# Patient Record
Sex: Female | Born: 1975 | Race: White | Hispanic: No | Marital: Single | Smoking: Current every day smoker
Health system: Southern US, Community
[De-identification: ages and names within clinical notes are randomized; demographics above are authoritative.]

## PROBLEM LIST (undated history)

## (undated) DIAGNOSIS — K219 Gastro-esophageal reflux disease without esophagitis: Secondary | ICD-10-CM

## (undated) DIAGNOSIS — J309 Allergic rhinitis, unspecified: Secondary | ICD-10-CM

## (undated) DIAGNOSIS — Z720 Tobacco use: Secondary | ICD-10-CM

## (undated) DIAGNOSIS — F419 Anxiety disorder, unspecified: Secondary | ICD-10-CM

## (undated) DIAGNOSIS — J45909 Unspecified asthma, uncomplicated: Secondary | ICD-10-CM

## (undated) DIAGNOSIS — F329 Major depressive disorder, single episode, unspecified: Secondary | ICD-10-CM

## (undated) DIAGNOSIS — G629 Polyneuropathy, unspecified: Secondary | ICD-10-CM

## (undated) DIAGNOSIS — E119 Type 2 diabetes mellitus without complications: Secondary | ICD-10-CM

## (undated) DIAGNOSIS — F32A Depression, unspecified: Secondary | ICD-10-CM

## (undated) HISTORY — DX: Unspecified asthma, uncomplicated: J45.909

## (undated) HISTORY — DX: Allergic rhinitis, unspecified: J30.9

## (undated) HISTORY — DX: Gastro-esophageal reflux disease without esophagitis: K21.9

## (undated) HISTORY — DX: Polyneuropathy, unspecified: G62.9

## (undated) HISTORY — DX: Type 2 diabetes mellitus without complications: E11.9

## (undated) HISTORY — DX: Tobacco use: Z72.0

---
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
## 1998-02-23 ENCOUNTER — Inpatient Hospital Stay (HOSPITAL_COMMUNITY): Admission: AD | Admit: 1998-02-23 | Discharge: 1998-02-28 | Payer: Self-pay | Admitting: *Deleted

## 1998-02-24 ENCOUNTER — Encounter: Payer: Self-pay | Admitting: *Deleted

## 1998-12-26 ENCOUNTER — Inpatient Hospital Stay (HOSPITAL_COMMUNITY): Admission: RE | Admit: 1998-12-26 | Discharge: 1998-12-28 | Payer: Self-pay | Admitting: *Deleted

## 1998-12-27 ENCOUNTER — Encounter: Payer: Self-pay | Admitting: *Deleted

## 1999-10-13 ENCOUNTER — Other Ambulatory Visit: Admission: RE | Admit: 1999-10-13 | Discharge: 1999-10-13 | Payer: Self-pay | Admitting: Obstetrics and Gynecology

## 1999-10-13 ENCOUNTER — Encounter (INDEPENDENT_AMBULATORY_CARE_PROVIDER_SITE_OTHER): Payer: Self-pay

## 1999-10-14 ENCOUNTER — Inpatient Hospital Stay (HOSPITAL_COMMUNITY): Admission: EM | Admit: 1999-10-14 | Discharge: 1999-10-21 | Payer: Self-pay | Admitting: Psychiatry

## 2001-01-27 ENCOUNTER — Inpatient Hospital Stay (HOSPITAL_COMMUNITY): Admission: EM | Admit: 2001-01-27 | Discharge: 2001-02-07 | Payer: Self-pay | Admitting: Psychiatry

## 2001-04-15 ENCOUNTER — Inpatient Hospital Stay (HOSPITAL_COMMUNITY): Admission: AD | Admit: 2001-04-15 | Discharge: 2001-04-21 | Payer: Self-pay | Admitting: Psychiatry

## 2002-05-03 ENCOUNTER — Inpatient Hospital Stay (HOSPITAL_COMMUNITY): Admission: EM | Admit: 2002-05-03 | Discharge: 2002-05-07 | Payer: Self-pay | Admitting: Psychiatry

## 2005-09-17 ENCOUNTER — Emergency Department (HOSPITAL_COMMUNITY): Admission: EM | Admit: 2005-09-17 | Discharge: 2005-09-17 | Payer: Self-pay | Admitting: Emergency Medicine

## 2006-01-21 ENCOUNTER — Emergency Department (HOSPITAL_COMMUNITY): Admission: EM | Admit: 2006-01-21 | Discharge: 2006-01-21 | Payer: Self-pay | Admitting: Emergency Medicine

## 2006-08-25 ENCOUNTER — Inpatient Hospital Stay (HOSPITAL_COMMUNITY): Admission: AD | Admit: 2006-08-25 | Discharge: 2006-08-25 | Payer: Self-pay | Admitting: Family Medicine

## 2006-09-07 ENCOUNTER — Inpatient Hospital Stay (HOSPITAL_COMMUNITY): Admission: AD | Admit: 2006-09-07 | Discharge: 2006-09-07 | Payer: Self-pay | Admitting: Obstetrics and Gynecology

## 2006-10-05 ENCOUNTER — Emergency Department (HOSPITAL_COMMUNITY): Admission: EM | Admit: 2006-10-05 | Discharge: 2006-10-05 | Payer: Self-pay | Admitting: Emergency Medicine

## 2007-01-11 ENCOUNTER — Emergency Department (HOSPITAL_COMMUNITY): Admission: EM | Admit: 2007-01-11 | Discharge: 2007-01-11 | Payer: Self-pay | Admitting: Emergency Medicine

## 2007-02-19 ENCOUNTER — Emergency Department (HOSPITAL_COMMUNITY): Admission: EM | Admit: 2007-02-19 | Discharge: 2007-02-19 | Payer: Self-pay | Admitting: Emergency Medicine

## 2007-07-31 ENCOUNTER — Emergency Department (HOSPITAL_COMMUNITY): Admission: EM | Admit: 2007-07-31 | Discharge: 2007-07-31 | Payer: Self-pay | Admitting: Emergency Medicine

## 2007-09-16 ENCOUNTER — Encounter: Admission: RE | Admit: 2007-09-16 | Discharge: 2007-09-16 | Payer: Self-pay | Admitting: Family Medicine

## 2007-09-29 ENCOUNTER — Encounter (INDEPENDENT_AMBULATORY_CARE_PROVIDER_SITE_OTHER): Payer: Self-pay | Admitting: Radiology

## 2007-09-29 ENCOUNTER — Encounter: Admission: RE | Admit: 2007-09-29 | Discharge: 2007-09-29 | Payer: Self-pay | Admitting: Family Medicine

## 2007-11-21 ENCOUNTER — Emergency Department (HOSPITAL_COMMUNITY): Admission: EM | Admit: 2007-11-21 | Discharge: 2007-11-21 | Payer: Self-pay | Admitting: Emergency Medicine

## 2007-12-08 ENCOUNTER — Inpatient Hospital Stay (HOSPITAL_COMMUNITY): Admission: AD | Admit: 2007-12-08 | Discharge: 2007-12-08 | Payer: Self-pay | Admitting: Obstetrics & Gynecology

## 2008-01-29 ENCOUNTER — Emergency Department (HOSPITAL_COMMUNITY): Admission: EM | Admit: 2008-01-29 | Discharge: 2008-01-29 | Payer: Self-pay | Admitting: Emergency Medicine

## 2008-07-21 ENCOUNTER — Emergency Department (HOSPITAL_COMMUNITY): Admission: EM | Admit: 2008-07-21 | Discharge: 2008-07-21 | Payer: Self-pay | Admitting: Family Medicine

## 2008-09-05 ENCOUNTER — Emergency Department (HOSPITAL_COMMUNITY): Admission: EM | Admit: 2008-09-05 | Discharge: 2008-09-05 | Payer: Self-pay | Admitting: Emergency Medicine

## 2008-11-09 ENCOUNTER — Emergency Department (HOSPITAL_COMMUNITY): Admission: EM | Admit: 2008-11-09 | Discharge: 2008-11-09 | Payer: Self-pay | Admitting: Emergency Medicine

## 2009-01-19 ENCOUNTER — Emergency Department (HOSPITAL_COMMUNITY): Admission: EM | Admit: 2009-01-19 | Discharge: 2009-01-19 | Payer: Self-pay | Admitting: Emergency Medicine

## 2009-04-05 ENCOUNTER — Emergency Department (HOSPITAL_COMMUNITY): Admission: EM | Admit: 2009-04-05 | Discharge: 2009-04-05 | Payer: Self-pay | Admitting: Emergency Medicine

## 2009-04-09 ENCOUNTER — Emergency Department (HOSPITAL_COMMUNITY): Admission: EM | Admit: 2009-04-09 | Discharge: 2009-04-09 | Payer: Self-pay | Admitting: Emergency Medicine

## 2010-07-20 LAB — URINALYSIS, ROUTINE W REFLEX MICROSCOPIC
Glucose, UA: NEGATIVE mg/dL
Hgb urine dipstick: NEGATIVE
Ketones, ur: NEGATIVE mg/dL
Protein, ur: NEGATIVE mg/dL
Urobilinogen, UA: 0.2 mg/dL (ref 0.0–1.0)

## 2010-07-20 LAB — WET PREP, GENITAL
Clue Cells Wet Prep HPF POC: NONE SEEN
Trich, Wet Prep: NONE SEEN
WBC, Wet Prep HPF POC: NONE SEEN
Yeast Wet Prep HPF POC: NONE SEEN

## 2010-07-25 LAB — URINALYSIS, ROUTINE W REFLEX MICROSCOPIC
Ketones, ur: NEGATIVE mg/dL
Leukocytes, UA: NEGATIVE
Nitrite: NEGATIVE
Protein, ur: NEGATIVE mg/dL
Urobilinogen, UA: 0.2 mg/dL (ref 0.0–1.0)

## 2010-07-25 LAB — URINE MICROSCOPIC-ADD ON

## 2010-09-01 NOTE — H&P (Signed)
[facility]  Patient:    Carol Vazquez, Carol Vazquez Visit Number: [id number] MRN: [id number]          Service Type: PSY Location: 500 0505 01 Attending Physician:  Rachael Fee Dictated by:   Candi Leash. Orsini, N.P. Admit Date:  04/15/2001                     Psychiatric Admission Assessment  DATE OF ADMISSION: April 15, 2001  IDENTIFYING INFORMATION: This is a 35 year old, single white female, voluntary committed on April 14, 2001, for intentional overdose.  HISTORY OF PRESENT ILLNESS: The patient presents on commitment papers stating that patient has been depressed for the past two weeks. The patient reports that she woke up, thought someone had cut her hair. She got angry about that, yelled at her mother to go get her Ephedrine. The patient states she took an unknown quantity in front of her. The patient reports she has been crying a lot.  Her intention was to overdose was of anger denying any suicidal ideation. The patient reports that she has "bad thoughts" of jealously towards her mothers boyfriend but denies any homicidal ideation. She denies any auditory or visual hallucinations, expresses positive paranoid ideation, feels like people on TV are talking about her.  The patient wants the medicine to give her positive thoughts. The patient also reports that she feels like she has leprosy and showed me her callused feet. The patient has been taking six Ephedrine for the past eight years.  PAST PSYCHIATRIC HISTORY: History of paranoid schizophrenia, second visit to [facility]; last visit was two months ago when patient overdosed on Zyprexa. She sees Dr. Cheree Ditto in [place] at [facility]; last visit was one month ago.  SOCIAL HISTORY: A 35 year old, single white female with no children. She lives with her mother. She is on disability for a psychiatric illness, completed the eight grade. No legal problems.  FAMILY  HISTORY: None.  ALCOHOL/DRUG HISTORY: The patient smokes one-pack a day. Denies any alcohol or substance abuse.  PRIMARY CARE PHYSICIAN: Dr. Sheria Lang in [place].  PAST MEDICAL HISTORY: Medical problems are none.  MEDICATIONS: 1. Prolixin injection 25 mg every two weeks, injection to be given on    April 15, 2001. 2. She has been on Prozac for the past two months, but she states it has    "made her mean" and stopped it three weeks ago.  ALLERGIES: No known drug allergies.  PHYSICAL EXAMINATION: Physical examination was performed at [facility]. WBC count was elevated at 25.5 with a platelet count elevated at 445. Her neutrophil count was elevated at 21.9. CMET was at the normal limits. Alcohol level is 0. Urine drug screen was negative. Acetaminophen level is less than 1, silicate level was less than 0.5.  MENTAL STATUS EXAMINATION: She is an alert, young adult, cooperative female. Her makeup is smeared. Her eyebrows are shaved. She has drawn-in eyebrows. Hair is short. Speech is normal with some irrelevance. Mood is depressed and anxiety. Affect is depressed and flat. Thought processes are positive. Delusions positive, positive paranoia.  No auditory or visual hallucinations. No suicidal or homicidal ideation. Cognitive of function is intact. Memory is fair. Judgment is poor. Insight is poor. Poor impulse control.  ADMISSION DIAGNOSES: Axis I:    Paranoid schizophrenia. Axis II:   Deferred. Axis III:  None. Axis IV:   Problems with primary support group and other psychosocial  problems. Axis V:    Current is 30, estimated this past year is 60.  PLAN: Involuntary committed to [facility] for intentional overdose and delusions. Contract for safety. Check q.15 minutes. The patient promises safety. Will resume her Prolixin injection. Will add an antidepressant to decrease depressive symptoms.  Will obtain labs, repeat her CBC.  Our goal is to  stabilize her mood and thinking so the patient can be safe to follow up with Mental Health, to be medication compliant.  TENTATIVE LENGTH OF STAY: Three to five days. Dictated by:   Candi Leash. Orsini, N.P. Attending Physician:  Rachael Fee DD:  04/15/01 TD:  04/15/01 Job: [id number]

## 2010-09-01 NOTE — H&P (Signed)
NAME:  Carol, Vazquez                        ACCOUNT NO.:  [id number]   MEDICAL RECORD NO.:  [id number]                   PATIENT TYPE:  IPS   LOCATION:  0400                                 [facility]ILITY[facility]  [facility]   PHYSICIAN:  Carol Vazquez, M.D.              DATE OF BIRTH:  1975/06/08   DATE OF ADMISSION:  05/03/2002  DATE OF DISCHARGE:                         PSYCHIATRIC ADMISSION ASSESSMENT   IDENTIFYING INFORMATION:  This is a 35 year old white female who is single,  voluntary admission.   HISTORY OF PRESENT ILLNESS:  This patient, with a history of schizophrenia,  paranoid-type, was watching television with her family.  She then  disappeared for a while, came back and, several minutes later, just passed  out in front of the television.  Her mother took her to the emergency room  where she was admitted to the ICU for a presumed sympathomimetic overdose.  Apparently, this patient has a problem with chronically abusing Ephedra and  so her mother assumed that that would be the typical thing that she would  have taken, although they were not clear about this.  When the patient  awakened in the ICU, she told her mother that she had not taken Ephedra but  rather an old prescription of Cogentin that was hidden back in the bedroom  and it is unclear how much she took.  She was actually stable without  arrhythmia in the ICU.  However, she was tachycardic.  Today, she is  cooperative with a detached affect and manner and somewhat inappropriate  smile.  She denies that she has been having any hallucinations, denies that  she has had any real suicidal or homicidal thoughts.  She says she thought  if she took the Cogentin, somehow it would make her think a little bit more  clearly.   PAST PSYCHIATRIC HISTORY:  The patient has been followed at [facility] for many years.  She has a long history of paranoid  schizophrenia.  This is her fourth admission to [facility] with her last one being in January 2003.  Most recently, she was also  at [facility] in September of 2003.  She has a history of drug  overdoses in the past including one prior admission for overdose on Zyprexa.  She chronically abuses Ephedra, which she buys over-the-counter at the gas  station when she is out with her boyfriend.   SOCIAL HISTORY:  This is a single female, never married.  She has a stable  sexual relationship with her boyfriend.  She currently lives with her  mother, who she has lived with for many years.  She has no legal charges  against her.  She lives in a stable home environment with good support and  her mother is willing to take her back home.  Reports that generally they  get along well.  Her mother has been  keeping her medications and controlling  all of her medications to keep her from overdosing and the mother is quite  remorseful and did not realize that there was an old prescription of  Cogentin, which she has now disposed of.   FAMILY HISTORY:  Unclear.   ALCOHOL/DRUG HISTORY:  The patient has been guilty of abusing Ephedra only  as far as we know in the past and this has been variable.  She does not take  it everyday but they find her taking excessive amounts occasionally.   PAST MEDICAL HISTORY:  The patient's primary care Carol Vazquez is not known.  Medical problems are none.  Past medical history is remarkable for  spontaneous abortion at age 59.  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   REVIEW OF SYSTEMS:  Today is remarkable for patient's denial of any somatic  symptoms other than feeling a little bit tired and she wants to know when  she can get her regular medicines back.  She denies any problems with  urinary retention.   MEDICATIONS:  Abilify 15 mg p.o. q.h.s., Protonix 40 mg daily and Kariva  oral contraceptive tablets, which she takes one  daily.  The patient's mother  reports that she monitors her closely.  She rarely refuses the Abilify and  her mother feels that she has functioned much better on the Abilify.  She  previously had taken Zyprexa and spent of the time sleeping and it increased  her appetite tremendously.  She has lost some weight on the Abilify but her  mother says she is much more awake and is able to talk and do things during  the day.   ALLERGIES:  IVP DYE.   POSITIVE PHYSICAL FINDINGS:  The patient's physical examination is noted in  the record.  It was done at the ICU at [facility].  Her physical  examination is generally unremarkable.  However, she has dyed her hair now,  flaming orange and is clipped close to the scalp.   LABORATORY DATA:  In the emergency room revealed that her urinalysis was  within normal limits.  Her metabolic panel revealed a mildly elevated sodium  at 114.  Other than that, everything else is norma.  BUN 5, creatinine 0.8.  Other electrolytes were normal.  Acetaminophen level was less than 1.  Salicylate level was less than 0.5.  Her urine drug screen was positive for  methamphetamines and benzodiazepines.  The patient and mother deny that she  abuses anything other than the Ephedra.  Urine pregnancy test was negative.   MENTAL STATUS EXAM:  This is a petite female who weighed 112 pounds at the  time of admission and she did continue to be somewhat tachycardic at 114.  She is petite with close-clipped hair that is dyed a neon orange color at  this time.  Her affect is detached with an inappropriate constant smile.  She seems to markedly lack volition.  She is calm and cooperative.  Her  concerns actually are rather appropriate at this time, wanting to know when  would she be allowed to get her medications and what would be the timing and  her doses.  Wants to know when she will be able to go home and what she has to do here in order to put herself  in the position to  be discharged back  home.  She is concerned about what her mother thinks of this  incident and  wants to speak to her mother.  Her speech is generally normal without  pressure.  Her mood is fairly euthymic.  She seems quite detached.  Does not  seem to have any remorse or feeling one way or the other about the incident.  She is very mildly guarded at times.  Thought process is remarkable for some  vague paranoia but no overt hallucinations, no overt suicidal or homicidal  ideation.  She denies any intent to kill herself whatsoever.  Cognitively,  she is intact and oriented x 3.  Insight poor.  Impulse control and judgment  questionable.  Intelligence average.   DIAGNOSES:   AXIS I:  1. Schizophrenia, paranoid-type.  2. Chronic stimulant abuse.  3. Rule out depression.   AXIS II:  Deferred.   AXIS III:  1. Status post anticholinergic overdose.  2. Gastroesophageal reflux disease by history.   AXIS IV:  Deferred.   AXIS V:  Current 32; past year 16.   PLAN:  Voluntarily admit the patient to evaluate her mood and her mental  status after her overdose.  To evaluate her control of her schizophrenia.  We will restart her Abilify 15 mg and evaluate and observe her for the  possibility for need for an antidepressant.  We will also solicit feedback  from her mother early on here regarding her mother's concerns and how she  feels her progress has been.   ESTIMATED LENGTH OF STAY:  Five to seven days.     Margaret A. Stephannie Peters                   Carol Vazquez, M.D.    MAS/MEDQ  D:  05/05/2002  T:  05/05/2002  Job:  [id number]

## 2010-09-01 NOTE — Discharge Summary (Signed)
[facility]  Patient:    Carol Vazquez, Carol Vazquez Visit Number: [id number] MRN: [id number]          Service Type: PSY Location: 300 0300 01 Attending Physician:  Rachael Fee Dictated by:   Reymundo Poll Dub Mikes, M.D. Admit Date:  01/27/2001 Discharge Date: 02/07/2001                             Discharge Summary  CHIEF COMPLAINT AND PRESENT ILLNESS:  This was the second admission to [facility] for this 35 year old female voluntarily admitted after taking 12 Zyprexa an approximately four Prolixin 5 mg tablets.  She has a history of paranoid schizophrenia.  She was taken to the emergency room after taking the excess Zyprexa.  She claimed that she was not trying to kill herself.  She believes that her nose was swollen and she was trying to make the swelling go down and she thought the Zyprexa could help it.  She felt like people were trying to get into her house and cut her hair and her eyebrows while she was sleeping.  It appeared on evaluation that her eyebrows were shaved off.  Denied any suicidal ideas or homicidal ideas.  PAST PSYCHIATRIC HISTORY:  Followed by Dr. ______ at [facility], previous inpatient admission to [facility] in April 2002 another overdose of Zyprexa, was at [facility] in September 2001.  There are other hospitalizations as well as several suicidal attempts.  SUBSTANCE ABUSE HISTORY:  Denies the use or abuse of any substances.  PAST MEDICAL HISTORY:  Denied history of any major medical condition except having congenitally only one kidney.  MEDICATIONS UPON ADMISSION: 1. Zyprexa 10 mg in the morning and 20 at bedtime. 2. Prolixin 5 mg at bedtime.  ALLERGIES:  Denies any drug allergies.  PHYSICAL EXAMINATION:  Physical exam performed and failed to show any active findings.  MENTAL STATUS EXAMINATION:  Mental status exam upon admission revealed well-nourished,  well-developed female.  Mood anxious.  Affect incongruent, smiling at times inappropriately, did some staring into space and appeared to be responding to some internal stimuli.  She herself denied any auditory or visual hallucinations.  Passively cooperative and initially left the room suddenly while in the middle of the interview.  There is some response latency, no pressure, slow pace, single-word answers.  At times irritable. Denied any suicidal ideas or any homicidal ideas.  Cognition well preserved.  ADMITTING DIAGNOSES: Axis I:     Schizophrenia, chronic, undifferentiated. Axis II:    No diagnosis. Axis III:   No diagnosis. Axis IV:    Moderate. Axis V:     Global Assessment of Functioning upon admission 25-30; highest             Global Assessment of Functioning in the last year 55-60.  LABORATORY WORKUP:  CBC was within normal limits except for hemoglobin 11.9. Blood chemistries were within normal limits.  Thyroid profile was within normal limits.  COURSE IN THE HOSPITAL:  She was admitted and started intensive individual and group psychotherapy.  She had a prolonged stay due to persistent psychotic symptomatology.  She was very withdrawn, basically staying to herself, avoiding, going to the day room because she felt that TV was talking about her.  She herself was trying to put some logic into what is going on with her, stated that since she did not die when she overdosed on  the Zyprexa that maybe meant that she was not meant to die.  At times very circumstantial, very self-preoccupied about her appearance, very hopeless and helpless thinking that things were not going to change and a lot of distortion having to do with her appearance, especially that the face was deformed.  We continued to modify medications.  She stated that she wanted to be placed on Prolixin IM as she felt that the Prolixin IM did help her more than the Prolixin p.o. so we went ahead and started Prolixin  Decanoate.  On February 06, 2001, she experienced an increased temperature, we went ahead and held the neuroleptics and observed her for 24 hours, there were no complaints of rigidity or further episodes of increased blood pressure for which her usual medication regime was restarted. Still persisted with a distorted image of herself worrying about her hair, avoiding TV due to the messages.  In terms of overdosing, she said that she promised God not to overdose so she felt that she was going to be safe. Issues of loneliness, of being bored in the apartment, or having no energy, no motivation, lots of plans initially, makes a lot of plans, encouraged by therapies but then she does not follow through.  She was given ______ 200 mg to try to help with the negative symptoms.  She tolerated that well.  On February 07, 2001, she was much improved, there was some basic symptomatology that needed to be pursued further on an outpatient basis.  She wanted to be discharged and working towards discharge her mood improved and her affect became brighter but consistently denied any suicidal or homicidal ideas, was feeling more enthusiastic about the possibility of getting back with her rehab activities.  Family felt that she was better than baseline for which she was discharged to outpatient followup.  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 Axis II:    No diagnosis. Axis III:   Congenitally born with one kidney. Axis IV:    Moderate. Axis V:     Upon discharge 55-60.  DISCHARGE MEDICATIONS:  Zyprexa 10 mg one in the morning and two at bedtime, Prozac weekly, Provigil 200 mg in the morning, Prolixin Decanoate 25 mg every two weeks (last dose given one in the hospital, February 05, 2001) and Restoril 30 mg as needed for sleep.  FOLLOWUP:  She is going to  be followed with [facility]. Dictated by:   Reymundo Poll Dub Mikes, M.D. Attending Physician:  Rachael Fee  DD:  03/23/01 TD:  03/23/01 Job: [id number]

## 2010-09-01 NOTE — Discharge Summary (Signed)
[facility]  Patient:    Carol Vazquez, Carol Vazquez Visit Number: [id number] MRN: [id number]          Service Type: PSY Location: 500 0505 01 Attending Physician:  Rachael Fee Dictated by:   Reymundo Poll Dub Mikes, M.D. Admit Date:  04/15/2001 Discharge Date: 04/21/2001                             Discharge Summary  CHIEF COMPLAINT AND PRESENT ILLNESS:  This was the second admission in the last two months for this 35 year old female involuntarily committed for an intentional overdose.  The patient has been depressed for the past two weeks. Reports that she woke up, thought someone had cut her hair, got angry, yelled at her mother to get her ephedrine.  The patient states that she took an unknown quantity in front of her.  The patient reports she has been crying a lot.  Her intention was to overdose, was of anger, denied any suicidal ideation.  She reports that she has had bad thoughts of jealousy towards her mothers boyfriend.  Denies any homicidal ideation.  Denies any auditory or visual hallucinations.  Reports positive paranoid ideation.  Feels like people on TV are talking about her.  The patient once took medicine to give her positive thoughts.  Reports she feels like she has leprosy and showed the examiner her callous feet.  Has been taking 6 ephedrine for the past eight years.  PAST PSYCHIATRIC HISTORY:  Paranoid schizophrenia.  Second time at [facility].  Last time two months ago and she overdosed on Zyprexa.  Sees Dr. Cheree Ditto in [place].  ALCOHOL/DRUG HISTORY:  Denies the use or abuse of substances.  MEDICATIONS:  Prolixin Decanoate 25 mg every two weeks, Prozac for two months but made her mean.  She stopped taking it.  PHYSICAL EXAMINATION:  Performed and failed to show any acute findings.  MENTAL STATUS EXAMINATION:  Alert, young female.  Make-up is smeared.  Her eyebrows are shaved, drawn in eyebrows.  Hair is short.  Speech is normal  with some irrelevance.  Mood is depressed and anxious.  Affect is depressed and flat.  Thought processes are positive for delusions, paranoia, ideas of reference.  No auditory or visual hallucinations.  No suicidal or homicidal ideation.  Cognition well-preserved.  ADMISSION DIAGNOSES: Axis I:    Paranoid schizophrenia. Axis II:   Deferred. Axis III:  No diagnosis. Axis IV:   Moderate. Axis V:    Global Assessment of Functioning upon admission 30; highest Global            Assessment of Functioning in the last year 60.  LABORATORY DATA:  CBC was within normal limits.  Blood chemistries were within normal limits.  Thyroid profile was within normal limits.  Urine pregnancy was negative.  HOSPITAL COURSE:  She was admitted and started intensive individual and group psychotherapy.  We worked towards improving her reality testing.  Still with the fear about her hair.  We went ahead and discontinued the Zyprexa and tried Abilify 15 mg.  She ruminated about her face swelling.  She felt that the Abilify had caused it, so she wanted to take it again.  She felt concerned with her physical appearance, about feeling ugly.  She wanted to discontinue the Abilify and just stay on the Prolixin injection.  She had been started on Lexapro and Lexapro was increased to 10 mg per day.  On January  4th, she woke with the face not swollen, that was reassuring.  No energy in the morning, that is why she took ephedrine.  We went ahead and started Wellbutrin, that she tolerated quite well.  Went up to Wellbutrin SR 100 mg twice a day.  On January 6th, much improved.  Denies any active suicidal ideation.  She was willing to keep her medications and follow up on an outpatient basis.  As there were no suicidal or homicidal ideation and she expressed understanding of the need to continue taking her medications, we encouraged her compliance and we went ahead and discharged her to outpatient follow-up.  DISCHARGE  DIAGNOSES: Axis I:    1. Paranoid schizophrenia.            2. Rule out positive dysmorphic disorder. Axis II:   No diagnosis. Axis III:  No diagnosis. Axis IV:   Moderate. Axis V:    Global Assessment of Functioning upon discharge 50.  DISCHARGE MEDICATIONS: 1. Ativan 0.5 mg twice a day. 2. Prolixin Decanoate 25 mg every two weeks.  Next dose April 29, 2001. 3. Prolixin 5 mg at bedtime. 4. Lexapro 10 mg daily. 5. Wellbutrin SR 100 mg twice a day.  FOLLOW-UP:  [facility]. Dictated by:   Reymundo Poll Dub Mikes, M.D. Attending Physician:  Rachael Fee DD:  05/21/01 TD:  05/22/01 Job: [id number]

## 2010-09-01 NOTE — Discharge Summary (Signed)
NAME:  Carol Vazquez, Carol Vazquez                        ACCOUNT NO.:  [id number]   MEDICAL RECORD NO.:  [id number]                   PATIENT TYPE:  IPS   LOCATION:  0400                                 FACILITY:  [facility]   PHYSICIAN:  Jeanice Lim, M.D.              DATE OF BIRTH:  23-Jan-1976   DATE OF ADMISSION:  05/03/2002  DATE OF DISCHARGE:  05/07/2002                                 DISCHARGE SUMMARY   IDENTIFYING DATA:  This is a 35 year old, single, Caucasian female  voluntarily admitted, presenting with a history of schizophrenia, paranoid  type.  She passed out in front of the television.  She was admitted to the  ICU for presumed hypnomatic overdose.  The patient admitted to taking 20 mg  of Cogentin, feeling this would help her think better.  She had also been  taking ephedra and has a history of ephedra abuse and adjusting her  medications and not taking them as directed.   MEDICATIONS:  1. Abilify 15 mg q.h.s.  2. Protonix 40 mg a day.  3. Oral contraceptives.   Her mother feels that the patient had functioned better on Abilify.  She had  previously been on Zyprexa and spent a lot of time sleeping and had  increased appetite.   ALLERGIES:  IVP DYE.   PHYSICAL EXAMINATION:  Essentially within normal limits.  Performed in the  ICU at [facility].  Neurologically nonfocal.   LABORATORY DATA:  Routine admission labs showed sodium mildly elevated at  141 and otherwise within normal limits.  Urine drug screen positive for  methamphetamines and benzodiazepines.  Urine pregnancy test negative.   MENTAL STATUS EXAMINATION:  A small female.  Still somewhat tachycardic with  neo-orange colored hair.  Detached and inappropriate with a constant smile.  Mostly cooperative.  Concerned about this incident and how it will effect  her outpatient follow-up and her mother.  Thought process was delayed with  possible thought blocking and some positive delusional thoughts and fake  paranoia.  Cognition was intact.  Judgment and insight fair.  Intelligence  in low average range.   ADMISSION DIAGNOSES:   AXIS I:  1. Schizophrenia, paranoid type.  2. Chronic stimulant abuse.   AXIS II:  None.   AXIS III:  1. Status post anticholinergic overdose.  2. Gastrointestinal reflux disease by history.   AXIS IV:  Psychosocial Stressors:  Moderate.  Limited support system and  chronic illness.   AXIS V:  Global Assessment of Functioning:  32/50.   HOSPITAL COURSE:  The patient was admitted and ordered routine p.r.n.  medications.  She underwent further monitoring.  She was encouraged to  participate in individual, group, and milieu therapy.  She was placed on a  low-dose Librium protocol for possible benzodiazepine and alcohol abuse.  The patient was restarted on Abilify and then given Risperdal for report of  agitation and anxiety, as well as delusional  thoughts.  The patient was  given an IM Risperdal injection to increase the chances of compliance.  The  patient apparently did well on Prolixin injection in the past because he was  unable to adjust medications, which the patient was forthcoming about.  The  patient tolerated medication changes and showed a rapid improvement in  thinking, resolution of delusions thoughts, including that someone was  cutting her hair at night, and that she felt that she was being told to  sleep with women.  The patient denied any side effects from the medications.  The patient's condition at discharge was markedly improved.  Mood was more  euthymic.  Affect slightly brighter.  Thought process was more goal  directed.  Thought content negative for dangerous ideation or psychotic  symptoms.  The patient reported motivation to take medications as prescribed  and to stop using ephedra.  She was willing to have a urine toxicology  screen done to monitor this by her mental health center and possible ACT  team.   DISCHARGE MEDICATIONS:   1. Risperdal 1 mg q.h.s.  2. Risperdal CONSTA 25 mg IM every two weeks.   FOLLOW-UP:  The patient was to follow up at the [facility].  The patient has a follow-up  appointment on May 08, 2002, at 10:30 a.m.   DISCHARGE DIAGNOSES:   AXIS I:  1. Schizophrenia, paranoid type.  2. Chronic stimulant abuse.   AXIS II:  None.   AXIS III:  1. Status post anticholinergic overdose.  2. Gastrointestinal reflux disease by history.   AXIS IV:  Psychosocial Stressors:  Moderate.  Limited support system and  chronic illness.   AXIS V:  Global Assessment of Functioning:  50.                                               Jeanice Lim, M.D.    JEM/MEDQ  D:  05/14/2002  T:  05/14/2002  Job:  [id number]

## 2010-09-01 NOTE — H&P (Signed)
[facility]  Patient:    Carol Vazquez, Carol Vazquez Visit Number: [id number] MRN: [id number]          Service Type: PSY Location: 400 [id number] 01 Attending Physician:  Rachael Fee Dictated by:   Young Berry Scott, N.P. Admit Date:  01/27/2001                     Psychiatric Admission Assessment  DATE OF ADMISSION:  January 27, 2001  DATE OF ASSESSMENT:  January 27, 2001, at 10:15 a.m.  PATIENT IDENTIFICATION:  This is a 35 year old Caucasian female who is a voluntary admission after taking 12 Zyprexa 10 mg tablets and approximately four Prolixin 5 mg tablets.  HISTORY OF PRESENT ILLNESS:  This patient with a history of paranoid schizophrenia was taken to the ER after taking extra Zyprexa.  She says she was not trying to kill herself.  She was trying to take the extra Zyprexa because she believes that her nose is swollen and she was trying to make the swelling go down and she thought the Zyprexa would help that.  She also said she felt like people were trying to get into her house and cut her hair and her eyebrows while she was sleeping.  The patient today appears to have her eyebrows shaved off and then later in conversation, does admit that she had shaved her own eyebrows.  The patient denies any suicidal ideation or homicidal ideation.  She does stare quite a bit into space and there is considerable latency with response time to questions.  She is otherwise cooperative, has poor eye contact.  PAST PSYCHIATRIC HISTORY:  The patient is followed by Dr. Cheree Ditto at [facility].  She does have a previous inpatient admission to [facility] in April 2002 for another overdose on Zyprexa and also at [facility] in September 2001.  She has a history of other prior hospitalizations and several suicide attempts.  SUBSTANCE ABUSE HISTORY:  The patient does smoke cigarettes and reports occasional use of beer, denies any use  of illegal drugs.  Her urine drug screen is negative.  PAST MEDICAL HISTORY:  The patient is followed by Dr. Nedra Hai here in [place]. She denies any current medical problems.  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  Her past medical records also note a history of previous abnormal Pap smears which have been followed up.  MEDICATIONS: 1. Zyprexa 10 mg q.a.m. and 20 mg p.o. q.h.s. 2. Prolixin 5 mg p.o. q.h.s.  She is not a good historian with her medications and we plan to contact [facility] to obtain those.  DRUG ALLERGIES:  No known drug allergies.  PHYSICAL EXAMINATION:  GENERAL:  The patients PE was performed at [facility] and was unremarkable.  She is well-nourished, well-developed and generally healthy in appearance.  Gait is normal and she ambulates without assistance and is independent in her activities of daily living.  VITAL SIGNS:  On admission to the unit, temperature 97.1, pulse 116, respirations 20, blood pressure 129/82.  She weighs 123 pounds and is approximately 5 feet tall.  LABORATORY DATA:  Urine drug screen was negative for abnormal substances. Potassium was noted at 3.4.  Alcohol level was less than 5.  Urine pregnancy test was negative.  SOCIAL HISTORY:  The patient is single, never married.  She has no children. She lives at home with her mother and is on disability  secondary to her schizophrenia.  FAMILY HISTORY:  Unclear.  MENTAL STATUS EXAMINATION:  This patient has an incongruent affect and smiling at times inappropriately.  She is fully alert.  She does stare quite a bit into space and appears to be responding to some internal stimuli; however, she denies any auditory or visual hallucinations if asked directly.  She is passively cooperative and leaves the room suddenly during the interview, just stood up and walked out.  Speech includes considerable response latency, no pressure noted,  slow in pace, single word answers.  Mood is guarded and mildly irritable, seems quite detached.  She denies any suicidal ideation or homicidal ideation.  She does not evidence any dangerous ideations.  She has been calm and cooperative on the unit.  She does appear to be positive for auditory hallucinations, does not show any evidence of visual hallucinations. Cognitive: Intact to person, place, and situation.  She is not intact to day or date.  ADMISSION DIAGNOSES: Axis I:    Schizophrenia, not otherwise specified. Axis II:   Deferred. Axis III:  None. Axis IV:   Deferred. Axis V:    Current 30, past year 34.  INITIAL PLAN OF CARE:  Plan is to admit the patient voluntarily to treat her delusions and to improve her reality testing.  We will restart Zyprexa 10 mg in the morning and 20 mg at h.s. and will ask the RN to contact [facility] ASAP and get a current list of her medications, allergies, and recent history.  We will collect corroborating information from her family, especially her mother, and proceed with a plan from there.  ESTIMATED LENGTH OF STAY:  Four to five days. Dictated by:   Young Berry Scott, N.P. Attending Physician:  Rachael Fee DD:  01/31/01 TD:  02/02/01 Job: [id number]

## 2010-09-01 NOTE — Discharge Summary (Signed)
[facility]  Patient:    Carol Vazquez, Carol Vazquez                     MRN: [id number] Adm. Date:  [id number] Disc. Date: [id number] Attending:  Marlyn Corporal Fabmy                           Discharge Summary  ADMISSION DIAGNOSES: Axis I:    Schizophrenia, chronic undifferentiated type. Axis II:   Deferred. Axis III:  Abnormal Pap and biopsies three days prior to admission. Axis IV:   Severe, related to her mental illness. Axis V:    Global assessment of functioning of 35 on admission and 55 in the            past year.  DISCHARGE DIAGNOSES: Axis I:    Schizophrenia, chronic undifferentiated type. Axis II:   Deferred. Axis III:  Abnormal Pap smear. Axis IV:   Severe, related to her mental illness. Axis V:    Global assessment of functioning of 35 on admission and 50 on            discharge.  BRIEF HISTORY:  The patient is a 35 year old single Caucasian female who was admitted under commitment.  At the time of her admission, the patient was acutely suicidal and was experiencing auditory hallucinations and a thought disorder.  The patient believed there a computer chip implanted in her brain that was transmitting thoughts to perverted people.  She was also experiencing command hallucinations, instructing her to sleep with a gay woman when the patient herself recognizes she is a heterosexual.  PAST PSYCHIATRIC HISTORY:  She has a history of two suicidal attempts by overdose.  The patient has had several admissions to psychiatric hospitals in the past.  PERTINENT PHYSICAL AND LABORATORY DATA:  Physical examination on admission was unremarkable.  Admission lab work included a hemogram which was significant for leukocytosis with WBC of 11.5.  Routine chemistry was unremarkable.  Thyroid studies were normal.  Urine drug screen was negative.  HOSPITAL COURSE:  The patient received Ativan 1 mg p.o. q.6h. p.r.n. for agitation.  She was established on Seroquel 25  mg b.i.d. and 100 mg h.s. Later, increased to 100 mg a.m. and 200 mg h.s.  We gave her a shot of Prolixin Decanoate 35 mg IM on October 15, 1999.  She was also started on Effexor 75 mg q.d.  Subsequently, the Seroquel was increased to 150 mg q.a.m. and 300 mg h.s. with benefit.  Initially acutely psychotic, after hospitalization progressed, hallucinations resolved and the thought insertion and broadcasting experiences that were initially present disappeared.  The patient then acknowledged that she was not gay and that she felt good.  Again, as her hospitalization progressed, the psychomotor retardation she was experiencing initially resolved.  The patient was eventually seen by her mother who reported that she felt that her daughters thinking was much clearer and that she was ready for discharge.  CONDITION ON DISCHARGE:  Resolution of her psychosis.  The patient denies any suicidal or homicidal strivings.  MEDICATIONS:  The patient was discharged with prescription for Seroquel 100 mg 1-1/2 q.a.m. and 3 tabs h.s., Effexor XR, 75 mg q.d.  She is to follow at mental health for Prolixin Decanoate injection in the gluteus, 25 mg at two weekly intervals.  FOLLOW-UP:  She was referred to mental health and an appointment was arranged for her. DD:  12/18/99 TD:  12/19/99 Job: [id number]

## 2011-01-09 LAB — CBC
MCHC: 33.2
MCV: 94.9
Platelets: 330
WBC: 12.9 — ABNORMAL HIGH

## 2011-01-09 LAB — URINALYSIS, ROUTINE W REFLEX MICROSCOPIC
Bilirubin Urine: NEGATIVE
Glucose, UA: NEGATIVE
Hgb urine dipstick: NEGATIVE
Nitrite: NEGATIVE
Specific Gravity, Urine: 1.016
pH: 8

## 2011-01-09 LAB — COMPREHENSIVE METABOLIC PANEL
AST: 21
Albumin: 3.8
Calcium: 9.9
Chloride: 99
Creatinine, Ser: 0.85
GFR calc Af Amer: >60
Sodium: 140

## 2011-01-09 LAB — DIFFERENTIAL
Eosinophils Relative: 0
Lymphocytes Relative: 18
Lymphs Abs: 2.4
Monocytes Absolute: 0.5

## 2011-01-09 LAB — URINE MICROSCOPIC-ADD ON

## 2011-01-09 LAB — PREGNANCY, URINE: Preg Test, Ur: NEGATIVE

## 2011-01-31 LAB — WET PREP, GENITAL

## 2011-04-20 DIAGNOSIS — J441 Chronic obstructive pulmonary disease with (acute) exacerbation: Secondary | ICD-10-CM | POA: Diagnosis not present

## 2011-04-25 DIAGNOSIS — J45901 Unspecified asthma with (acute) exacerbation: Secondary | ICD-10-CM | POA: Diagnosis not present

## 2011-04-25 DIAGNOSIS — T7840XA Allergy, unspecified, initial encounter: Secondary | ICD-10-CM | POA: Diagnosis not present

## 2011-05-28 DIAGNOSIS — F200 Paranoid schizophrenia: Secondary | ICD-10-CM | POA: Diagnosis not present

## 2011-07-23 DIAGNOSIS — Z20828 Contact with and (suspected) exposure to other viral communicable diseases: Secondary | ICD-10-CM | POA: Diagnosis not present

## 2011-07-23 DIAGNOSIS — E7800 Pure hypercholesterolemia, unspecified: Secondary | ICD-10-CM | POA: Diagnosis not present

## 2011-07-23 DIAGNOSIS — T7589XA Other specified effects of external causes, initial encounter: Secondary | ICD-10-CM | POA: Diagnosis not present

## 2011-07-23 DIAGNOSIS — F200 Paranoid schizophrenia: Secondary | ICD-10-CM | POA: Diagnosis not present

## 2011-07-23 DIAGNOSIS — R109 Unspecified abdominal pain: Secondary | ICD-10-CM | POA: Diagnosis not present

## 2011-07-23 DIAGNOSIS — Z7289 Other problems related to lifestyle: Secondary | ICD-10-CM | POA: Diagnosis not present

## 2011-07-23 DIAGNOSIS — R82998 Other abnormal findings in urine: Secondary | ICD-10-CM | POA: Diagnosis not present

## 2011-07-23 DIAGNOSIS — R5383 Other fatigue: Secondary | ICD-10-CM | POA: Diagnosis not present

## 2011-07-23 DIAGNOSIS — Z1159 Encounter for screening for other viral diseases: Secondary | ICD-10-CM | POA: Diagnosis not present

## 2011-07-23 DIAGNOSIS — K219 Gastro-esophageal reflux disease without esophagitis: Secondary | ICD-10-CM | POA: Diagnosis not present

## 2011-07-24 DIAGNOSIS — Z79899 Other long term (current) drug therapy: Secondary | ICD-10-CM | POA: Diagnosis not present

## 2011-08-04 DIAGNOSIS — J019 Acute sinusitis, unspecified: Secondary | ICD-10-CM | POA: Diagnosis not present

## 2011-10-22 DIAGNOSIS — F200 Paranoid schizophrenia: Secondary | ICD-10-CM | POA: Diagnosis not present

## 2011-10-22 DIAGNOSIS — J309 Allergic rhinitis, unspecified: Secondary | ICD-10-CM | POA: Diagnosis not present

## 2011-10-22 DIAGNOSIS — J45909 Unspecified asthma, uncomplicated: Secondary | ICD-10-CM | POA: Diagnosis not present

## 2011-10-24 DIAGNOSIS — N760 Acute vaginitis: Secondary | ICD-10-CM | POA: Diagnosis not present

## 2011-10-24 DIAGNOSIS — R300 Dysuria: Secondary | ICD-10-CM | POA: Diagnosis not present

## 2011-10-31 DIAGNOSIS — R109 Unspecified abdominal pain: Secondary | ICD-10-CM | POA: Diagnosis not present

## 2011-10-31 DIAGNOSIS — R112 Nausea with vomiting, unspecified: Secondary | ICD-10-CM | POA: Diagnosis not present

## 2011-11-26 DIAGNOSIS — F200 Paranoid schizophrenia: Secondary | ICD-10-CM | POA: Diagnosis not present

## 2012-02-04 DIAGNOSIS — F200 Paranoid schizophrenia: Secondary | ICD-10-CM | POA: Diagnosis not present

## 2012-02-07 DIAGNOSIS — J209 Acute bronchitis, unspecified: Secondary | ICD-10-CM | POA: Diagnosis not present

## 2012-02-07 DIAGNOSIS — R079 Chest pain, unspecified: Secondary | ICD-10-CM | POA: Diagnosis not present

## 2012-02-07 DIAGNOSIS — R0602 Shortness of breath: Secondary | ICD-10-CM | POA: Diagnosis not present

## 2012-02-16 DIAGNOSIS — K649 Unspecified hemorrhoids: Secondary | ICD-10-CM | POA: Diagnosis not present

## 2012-02-16 DIAGNOSIS — R197 Diarrhea, unspecified: Secondary | ICD-10-CM | POA: Diagnosis not present

## 2012-02-21 DIAGNOSIS — K921 Melena: Secondary | ICD-10-CM | POA: Diagnosis not present

## 2012-03-04 DIAGNOSIS — K644 Residual hemorrhoidal skin tags: Secondary | ICD-10-CM | POA: Diagnosis not present

## 2012-03-04 DIAGNOSIS — J45909 Unspecified asthma, uncomplicated: Secondary | ICD-10-CM | POA: Diagnosis not present

## 2012-03-04 DIAGNOSIS — K921 Melena: Secondary | ICD-10-CM | POA: Diagnosis not present

## 2012-03-04 DIAGNOSIS — K648 Other hemorrhoids: Secondary | ICD-10-CM | POA: Diagnosis not present

## 2012-03-04 DIAGNOSIS — F17200 Nicotine dependence, unspecified, uncomplicated: Secondary | ICD-10-CM | POA: Diagnosis not present

## 2012-03-04 DIAGNOSIS — Z800 Family history of malignant neoplasm of digestive organs: Secondary | ICD-10-CM | POA: Diagnosis not present

## 2012-03-04 DIAGNOSIS — D352 Benign neoplasm of pituitary gland: Secondary | ICD-10-CM | POA: Diagnosis not present

## 2012-03-10 DIAGNOSIS — F200 Paranoid schizophrenia: Secondary | ICD-10-CM | POA: Diagnosis not present

## 2012-03-20 DIAGNOSIS — K644 Residual hemorrhoidal skin tags: Secondary | ICD-10-CM | POA: Diagnosis not present

## 2012-04-21 DIAGNOSIS — J45909 Unspecified asthma, uncomplicated: Secondary | ICD-10-CM | POA: Diagnosis not present

## 2012-04-21 DIAGNOSIS — F17200 Nicotine dependence, unspecified, uncomplicated: Secondary | ICD-10-CM | POA: Diagnosis not present

## 2012-04-21 DIAGNOSIS — K449 Diaphragmatic hernia without obstruction or gangrene: Secondary | ICD-10-CM | POA: Diagnosis not present

## 2012-04-21 DIAGNOSIS — Z79899 Other long term (current) drug therapy: Secondary | ICD-10-CM | POA: Diagnosis not present

## 2012-04-21 DIAGNOSIS — F329 Major depressive disorder, single episode, unspecified: Secondary | ICD-10-CM | POA: Diagnosis not present

## 2012-04-21 DIAGNOSIS — K648 Other hemorrhoids: Secondary | ICD-10-CM | POA: Diagnosis not present

## 2012-04-21 DIAGNOSIS — K644 Residual hemorrhoidal skin tags: Secondary | ICD-10-CM | POA: Diagnosis not present

## 2012-04-21 DIAGNOSIS — K649 Unspecified hemorrhoids: Secondary | ICD-10-CM | POA: Diagnosis not present

## 2012-04-22 DIAGNOSIS — G8918 Other acute postprocedural pain: Secondary | ICD-10-CM | POA: Diagnosis not present

## 2012-04-22 DIAGNOSIS — F17200 Nicotine dependence, unspecified, uncomplicated: Secondary | ICD-10-CM | POA: Diagnosis not present

## 2012-04-22 DIAGNOSIS — Z9889 Other specified postprocedural states: Secondary | ICD-10-CM | POA: Diagnosis not present

## 2012-04-22 DIAGNOSIS — K6289 Other specified diseases of anus and rectum: Secondary | ICD-10-CM | POA: Diagnosis not present

## 2012-04-22 DIAGNOSIS — R350 Frequency of micturition: Secondary | ICD-10-CM | POA: Diagnosis not present

## 2012-04-22 DIAGNOSIS — R300 Dysuria: Secondary | ICD-10-CM | POA: Diagnosis not present

## 2012-05-09 DIAGNOSIS — N3000 Acute cystitis without hematuria: Secondary | ICD-10-CM | POA: Diagnosis not present

## 2012-05-09 DIAGNOSIS — R300 Dysuria: Secondary | ICD-10-CM | POA: Diagnosis not present

## 2012-05-19 DIAGNOSIS — J069 Acute upper respiratory infection, unspecified: Secondary | ICD-10-CM | POA: Diagnosis not present

## 2012-05-19 DIAGNOSIS — Z79899 Other long term (current) drug therapy: Secondary | ICD-10-CM | POA: Diagnosis not present

## 2012-05-19 DIAGNOSIS — F200 Paranoid schizophrenia: Secondary | ICD-10-CM | POA: Diagnosis not present

## 2012-05-19 DIAGNOSIS — E7800 Pure hypercholesterolemia, unspecified: Secondary | ICD-10-CM | POA: Diagnosis not present

## 2012-05-19 DIAGNOSIS — N390 Urinary tract infection, site not specified: Secondary | ICD-10-CM | POA: Diagnosis not present

## 2012-05-19 DIAGNOSIS — N898 Other specified noninflammatory disorders of vagina: Secondary | ICD-10-CM | POA: Diagnosis not present

## 2012-06-30 DIAGNOSIS — F200 Paranoid schizophrenia: Secondary | ICD-10-CM | POA: Diagnosis not present

## 2012-07-03 DIAGNOSIS — K429 Umbilical hernia without obstruction or gangrene: Secondary | ICD-10-CM | POA: Diagnosis not present

## 2012-07-03 DIAGNOSIS — Z5309 Procedure and treatment not carried out because of other contraindication: Secondary | ICD-10-CM | POA: Diagnosis not present

## 2012-07-03 DIAGNOSIS — J40 Bronchitis, not specified as acute or chronic: Secondary | ICD-10-CM | POA: Diagnosis not present

## 2012-07-04 DIAGNOSIS — J449 Chronic obstructive pulmonary disease, unspecified: Secondary | ICD-10-CM | POA: Diagnosis not present

## 2012-07-04 DIAGNOSIS — K219 Gastro-esophageal reflux disease without esophagitis: Secondary | ICD-10-CM | POA: Diagnosis not present

## 2012-08-02 DIAGNOSIS — N390 Urinary tract infection, site not specified: Secondary | ICD-10-CM | POA: Diagnosis not present

## 2012-08-06 DIAGNOSIS — N390 Urinary tract infection, site not specified: Secondary | ICD-10-CM | POA: Diagnosis not present

## 2012-08-06 DIAGNOSIS — N23 Unspecified renal colic: Secondary | ICD-10-CM | POA: Diagnosis not present

## 2012-08-06 DIAGNOSIS — R109 Unspecified abdominal pain: Secondary | ICD-10-CM | POA: Diagnosis not present

## 2012-08-06 DIAGNOSIS — R300 Dysuria: Secondary | ICD-10-CM | POA: Diagnosis not present

## 2012-08-06 DIAGNOSIS — F17200 Nicotine dependence, unspecified, uncomplicated: Secondary | ICD-10-CM | POA: Diagnosis not present

## 2012-08-15 DIAGNOSIS — N390 Urinary tract infection, site not specified: Secondary | ICD-10-CM | POA: Diagnosis not present

## 2012-08-15 DIAGNOSIS — N200 Calculus of kidney: Secondary | ICD-10-CM | POA: Diagnosis not present

## 2012-08-15 DIAGNOSIS — R3915 Urgency of urination: Secondary | ICD-10-CM | POA: Diagnosis not present

## 2012-08-15 DIAGNOSIS — N23 Unspecified renal colic: Secondary | ICD-10-CM | POA: Diagnosis not present

## 2012-08-26 DIAGNOSIS — N3090 Cystitis, unspecified without hematuria: Secondary | ICD-10-CM | POA: Diagnosis not present

## 2012-08-26 DIAGNOSIS — N3000 Acute cystitis without hematuria: Secondary | ICD-10-CM | POA: Diagnosis not present

## 2012-09-12 DIAGNOSIS — K429 Umbilical hernia without obstruction or gangrene: Secondary | ICD-10-CM | POA: Diagnosis not present

## 2012-09-12 DIAGNOSIS — Z79899 Other long term (current) drug therapy: Secondary | ICD-10-CM | POA: Diagnosis not present

## 2012-09-12 DIAGNOSIS — E7800 Pure hypercholesterolemia, unspecified: Secondary | ICD-10-CM | POA: Diagnosis not present

## 2012-09-12 DIAGNOSIS — L551 Sunburn of second degree: Secondary | ICD-10-CM | POA: Diagnosis not present

## 2012-09-16 DIAGNOSIS — F200 Paranoid schizophrenia: Secondary | ICD-10-CM | POA: Diagnosis not present

## 2012-12-18 DIAGNOSIS — K429 Umbilical hernia without obstruction or gangrene: Secondary | ICD-10-CM | POA: Diagnosis not present

## 2012-12-22 DIAGNOSIS — F209 Schizophrenia, unspecified: Secondary | ICD-10-CM | POA: Diagnosis not present

## 2012-12-26 DIAGNOSIS — Z0389 Encounter for observation for other suspected diseases and conditions ruled out: Secondary | ICD-10-CM | POA: Diagnosis not present

## 2012-12-26 DIAGNOSIS — K429 Umbilical hernia without obstruction or gangrene: Secondary | ICD-10-CM | POA: Diagnosis not present

## 2012-12-26 DIAGNOSIS — Z79899 Other long term (current) drug therapy: Secondary | ICD-10-CM | POA: Diagnosis not present

## 2012-12-26 DIAGNOSIS — I498 Other specified cardiac arrhythmias: Secondary | ICD-10-CM | POA: Diagnosis not present

## 2012-12-26 DIAGNOSIS — F209 Schizophrenia, unspecified: Secondary | ICD-10-CM | POA: Diagnosis not present

## 2012-12-26 DIAGNOSIS — F17200 Nicotine dependence, unspecified, uncomplicated: Secondary | ICD-10-CM | POA: Diagnosis not present

## 2012-12-26 DIAGNOSIS — J449 Chronic obstructive pulmonary disease, unspecified: Secondary | ICD-10-CM | POA: Diagnosis not present

## 2012-12-29 DIAGNOSIS — Z79899 Other long term (current) drug therapy: Secondary | ICD-10-CM | POA: Diagnosis not present

## 2012-12-29 DIAGNOSIS — F209 Schizophrenia, unspecified: Secondary | ICD-10-CM | POA: Diagnosis not present

## 2012-12-29 DIAGNOSIS — F17200 Nicotine dependence, unspecified, uncomplicated: Secondary | ICD-10-CM | POA: Diagnosis not present

## 2012-12-29 DIAGNOSIS — J449 Chronic obstructive pulmonary disease, unspecified: Secondary | ICD-10-CM | POA: Diagnosis not present

## 2012-12-29 DIAGNOSIS — K429 Umbilical hernia without obstruction or gangrene: Secondary | ICD-10-CM | POA: Diagnosis not present

## 2012-12-29 DIAGNOSIS — I498 Other specified cardiac arrhythmias: Secondary | ICD-10-CM | POA: Diagnosis not present

## 2013-02-03 DIAGNOSIS — H9209 Otalgia, unspecified ear: Secondary | ICD-10-CM | POA: Diagnosis not present

## 2013-02-03 DIAGNOSIS — G4733 Obstructive sleep apnea (adult) (pediatric): Secondary | ICD-10-CM | POA: Diagnosis not present

## 2013-02-27 DIAGNOSIS — J40 Bronchitis, not specified as acute or chronic: Secondary | ICD-10-CM | POA: Diagnosis not present

## 2013-02-27 DIAGNOSIS — J029 Acute pharyngitis, unspecified: Secondary | ICD-10-CM | POA: Diagnosis not present

## 2013-02-27 DIAGNOSIS — IMO0001 Reserved for inherently not codable concepts without codable children: Secondary | ICD-10-CM | POA: Diagnosis not present

## 2013-03-08 DIAGNOSIS — J069 Acute upper respiratory infection, unspecified: Secondary | ICD-10-CM | POA: Diagnosis not present

## 2013-03-08 DIAGNOSIS — J209 Acute bronchitis, unspecified: Secondary | ICD-10-CM | POA: Diagnosis not present

## 2013-03-13 DIAGNOSIS — H60399 Other infective otitis externa, unspecified ear: Secondary | ICD-10-CM | POA: Diagnosis not present

## 2013-03-13 DIAGNOSIS — Z79899 Other long term (current) drug therapy: Secondary | ICD-10-CM | POA: Diagnosis not present

## 2013-03-13 DIAGNOSIS — M79609 Pain in unspecified limb: Secondary | ICD-10-CM | POA: Diagnosis not present

## 2013-03-13 DIAGNOSIS — E782 Mixed hyperlipidemia: Secondary | ICD-10-CM | POA: Diagnosis not present

## 2013-03-16 DIAGNOSIS — F209 Schizophrenia, unspecified: Secondary | ICD-10-CM | POA: Diagnosis not present

## 2013-03-23 DIAGNOSIS — M79609 Pain in unspecified limb: Secondary | ICD-10-CM | POA: Diagnosis not present

## 2013-04-02 DIAGNOSIS — M79609 Pain in unspecified limb: Secondary | ICD-10-CM | POA: Diagnosis not present

## 2013-04-04 DIAGNOSIS — M79609 Pain in unspecified limb: Secondary | ICD-10-CM | POA: Diagnosis not present

## 2013-04-04 DIAGNOSIS — F17200 Nicotine dependence, unspecified, uncomplicated: Secondary | ICD-10-CM | POA: Diagnosis not present

## 2013-04-06 DIAGNOSIS — M79609 Pain in unspecified limb: Secondary | ICD-10-CM | POA: Diagnosis not present

## 2013-04-27 DIAGNOSIS — D72829 Elevated white blood cell count, unspecified: Secondary | ICD-10-CM | POA: Diagnosis not present

## 2013-04-27 DIAGNOSIS — F17200 Nicotine dependence, unspecified, uncomplicated: Secondary | ICD-10-CM | POA: Diagnosis not present

## 2013-04-27 DIAGNOSIS — K5289 Other specified noninfective gastroenteritis and colitis: Secondary | ICD-10-CM | POA: Diagnosis not present

## 2013-04-27 DIAGNOSIS — J449 Chronic obstructive pulmonary disease, unspecified: Secondary | ICD-10-CM | POA: Diagnosis not present

## 2013-04-27 DIAGNOSIS — E7800 Pure hypercholesterolemia, unspecified: Secondary | ICD-10-CM | POA: Diagnosis not present

## 2013-04-27 DIAGNOSIS — Z79899 Other long term (current) drug therapy: Secondary | ICD-10-CM | POA: Diagnosis not present

## 2013-04-27 DIAGNOSIS — F200 Paranoid schizophrenia: Secondary | ICD-10-CM | POA: Diagnosis not present

## 2013-04-27 DIAGNOSIS — K219 Gastro-esophageal reflux disease without esophagitis: Secondary | ICD-10-CM | POA: Diagnosis not present

## 2013-05-28 DIAGNOSIS — Z20828 Contact with and (suspected) exposure to other viral communicable diseases: Secondary | ICD-10-CM | POA: Diagnosis not present

## 2013-05-28 DIAGNOSIS — Z01419 Encounter for gynecological examination (general) (routine) without abnormal findings: Secondary | ICD-10-CM | POA: Diagnosis not present

## 2013-06-15 DIAGNOSIS — N3000 Acute cystitis without hematuria: Secondary | ICD-10-CM | POA: Diagnosis not present

## 2013-06-15 DIAGNOSIS — F209 Schizophrenia, unspecified: Secondary | ICD-10-CM | POA: Diagnosis not present

## 2013-06-15 DIAGNOSIS — M549 Dorsalgia, unspecified: Secondary | ICD-10-CM | POA: Diagnosis not present

## 2013-06-24 DIAGNOSIS — N949 Unspecified condition associated with female genital organs and menstrual cycle: Secondary | ICD-10-CM | POA: Diagnosis not present

## 2013-08-03 DIAGNOSIS — M79609 Pain in unspecified limb: Secondary | ICD-10-CM | POA: Diagnosis not present

## 2013-08-04 DIAGNOSIS — R252 Cramp and spasm: Secondary | ICD-10-CM | POA: Diagnosis not present

## 2013-08-04 DIAGNOSIS — M6281 Muscle weakness (generalized): Secondary | ICD-10-CM | POA: Diagnosis not present

## 2013-08-04 DIAGNOSIS — M79609 Pain in unspecified limb: Secondary | ICD-10-CM | POA: Diagnosis not present

## 2013-08-07 DIAGNOSIS — M79609 Pain in unspecified limb: Secondary | ICD-10-CM | POA: Diagnosis not present

## 2013-08-07 DIAGNOSIS — M6281 Muscle weakness (generalized): Secondary | ICD-10-CM | POA: Diagnosis not present

## 2013-08-07 DIAGNOSIS — R252 Cramp and spasm: Secondary | ICD-10-CM | POA: Diagnosis not present

## 2013-08-11 DIAGNOSIS — R252 Cramp and spasm: Secondary | ICD-10-CM | POA: Diagnosis not present

## 2013-08-11 DIAGNOSIS — M79609 Pain in unspecified limb: Secondary | ICD-10-CM | POA: Diagnosis not present

## 2013-08-11 DIAGNOSIS — M6281 Muscle weakness (generalized): Secondary | ICD-10-CM | POA: Diagnosis not present

## 2013-08-17 DIAGNOSIS — M6281 Muscle weakness (generalized): Secondary | ICD-10-CM | POA: Diagnosis not present

## 2013-08-17 DIAGNOSIS — M79609 Pain in unspecified limb: Secondary | ICD-10-CM | POA: Diagnosis not present

## 2013-08-17 DIAGNOSIS — R252 Cramp and spasm: Secondary | ICD-10-CM | POA: Diagnosis not present

## 2013-08-19 DIAGNOSIS — R252 Cramp and spasm: Secondary | ICD-10-CM | POA: Diagnosis not present

## 2013-08-19 DIAGNOSIS — M79609 Pain in unspecified limb: Secondary | ICD-10-CM | POA: Diagnosis not present

## 2013-08-19 DIAGNOSIS — M6281 Muscle weakness (generalized): Secondary | ICD-10-CM | POA: Diagnosis not present

## 2013-08-23 DIAGNOSIS — M79609 Pain in unspecified limb: Secondary | ICD-10-CM | POA: Diagnosis not present

## 2013-08-23 DIAGNOSIS — F17200 Nicotine dependence, unspecified, uncomplicated: Secondary | ICD-10-CM | POA: Diagnosis not present

## 2013-08-24 DIAGNOSIS — M79609 Pain in unspecified limb: Secondary | ICD-10-CM | POA: Diagnosis not present

## 2013-08-24 DIAGNOSIS — M6281 Muscle weakness (generalized): Secondary | ICD-10-CM | POA: Diagnosis not present

## 2013-08-24 DIAGNOSIS — R252 Cramp and spasm: Secondary | ICD-10-CM | POA: Diagnosis not present

## 2013-09-14 DIAGNOSIS — R252 Cramp and spasm: Secondary | ICD-10-CM | POA: Diagnosis not present

## 2013-09-14 DIAGNOSIS — F209 Schizophrenia, unspecified: Secondary | ICD-10-CM | POA: Diagnosis not present

## 2013-09-14 DIAGNOSIS — M6281 Muscle weakness (generalized): Secondary | ICD-10-CM | POA: Diagnosis not present

## 2013-09-14 DIAGNOSIS — M79609 Pain in unspecified limb: Secondary | ICD-10-CM | POA: Diagnosis not present

## 2013-09-21 DIAGNOSIS — M79609 Pain in unspecified limb: Secondary | ICD-10-CM | POA: Diagnosis not present

## 2013-09-21 DIAGNOSIS — R252 Cramp and spasm: Secondary | ICD-10-CM | POA: Diagnosis not present

## 2013-09-21 DIAGNOSIS — M6281 Muscle weakness (generalized): Secondary | ICD-10-CM | POA: Diagnosis not present

## 2013-09-28 DIAGNOSIS — H60399 Other infective otitis externa, unspecified ear: Secondary | ICD-10-CM | POA: Diagnosis not present

## 2013-09-28 DIAGNOSIS — M79609 Pain in unspecified limb: Secondary | ICD-10-CM | POA: Diagnosis not present

## 2013-09-30 DIAGNOSIS — M6281 Muscle weakness (generalized): Secondary | ICD-10-CM | POA: Diagnosis not present

## 2013-09-30 DIAGNOSIS — R252 Cramp and spasm: Secondary | ICD-10-CM | POA: Diagnosis not present

## 2013-09-30 DIAGNOSIS — M79609 Pain in unspecified limb: Secondary | ICD-10-CM | POA: Diagnosis not present

## 2013-10-05 DIAGNOSIS — J45909 Unspecified asthma, uncomplicated: Secondary | ICD-10-CM | POA: Diagnosis not present

## 2013-10-05 DIAGNOSIS — J309 Allergic rhinitis, unspecified: Secondary | ICD-10-CM | POA: Diagnosis not present

## 2013-10-17 DIAGNOSIS — H9209 Otalgia, unspecified ear: Secondary | ICD-10-CM | POA: Diagnosis not present

## 2013-10-17 DIAGNOSIS — N760 Acute vaginitis: Secondary | ICD-10-CM | POA: Diagnosis not present

## 2013-10-27 DIAGNOSIS — H60399 Other infective otitis externa, unspecified ear: Secondary | ICD-10-CM | POA: Diagnosis not present

## 2013-10-27 DIAGNOSIS — Z0110 Encounter for examination of ears and hearing without abnormal findings: Secondary | ICD-10-CM | POA: Diagnosis not present

## 2013-11-02 DIAGNOSIS — J309 Allergic rhinitis, unspecified: Secondary | ICD-10-CM | POA: Diagnosis not present

## 2013-11-02 DIAGNOSIS — J45909 Unspecified asthma, uncomplicated: Secondary | ICD-10-CM | POA: Diagnosis not present

## 2013-12-13 DIAGNOSIS — R112 Nausea with vomiting, unspecified: Secondary | ICD-10-CM | POA: Diagnosis not present

## 2013-12-13 DIAGNOSIS — K591 Functional diarrhea: Secondary | ICD-10-CM | POA: Diagnosis not present

## 2013-12-14 DIAGNOSIS — R197 Diarrhea, unspecified: Secondary | ICD-10-CM | POA: Diagnosis not present

## 2013-12-22 DIAGNOSIS — N390 Urinary tract infection, site not specified: Secondary | ICD-10-CM | POA: Diagnosis not present

## 2013-12-22 DIAGNOSIS — R109 Unspecified abdominal pain: Secondary | ICD-10-CM | POA: Diagnosis not present

## 2013-12-22 DIAGNOSIS — R3129 Other microscopic hematuria: Secondary | ICD-10-CM | POA: Diagnosis not present

## 2014-01-02 DIAGNOSIS — J45901 Unspecified asthma with (acute) exacerbation: Secondary | ICD-10-CM | POA: Diagnosis not present

## 2014-01-07 DIAGNOSIS — R1033 Periumbilical pain: Secondary | ICD-10-CM | POA: Diagnosis not present

## 2014-01-15 DIAGNOSIS — K529 Noninfective gastroenteritis and colitis, unspecified: Secondary | ICD-10-CM | POA: Diagnosis not present

## 2014-01-16 DIAGNOSIS — R197 Diarrhea, unspecified: Secondary | ICD-10-CM | POA: Diagnosis not present

## 2014-01-20 DIAGNOSIS — J4540 Moderate persistent asthma, uncomplicated: Secondary | ICD-10-CM | POA: Diagnosis not present

## 2014-01-20 DIAGNOSIS — J309 Allergic rhinitis, unspecified: Secondary | ICD-10-CM | POA: Diagnosis not present

## 2014-01-22 DIAGNOSIS — E039 Hypothyroidism, unspecified: Secondary | ICD-10-CM | POA: Diagnosis not present

## 2014-01-22 DIAGNOSIS — K529 Noninfective gastroenteritis and colitis, unspecified: Secondary | ICD-10-CM | POA: Diagnosis not present

## 2014-02-01 DIAGNOSIS — F17200 Nicotine dependence, unspecified, uncomplicated: Secondary | ICD-10-CM | POA: Diagnosis not present

## 2014-02-01 DIAGNOSIS — K219 Gastro-esophageal reflux disease without esophagitis: Secondary | ICD-10-CM | POA: Diagnosis not present

## 2014-02-01 DIAGNOSIS — T8584XA Pain due to internal prosthetic devices, implants and grafts, not elsewhere classified, initial encounter: Secondary | ICD-10-CM | POA: Diagnosis not present

## 2014-02-01 DIAGNOSIS — L923 Foreign body granuloma of the skin and subcutaneous tissue: Secondary | ICD-10-CM | POA: Diagnosis not present

## 2014-02-01 DIAGNOSIS — R1033 Periumbilical pain: Secondary | ICD-10-CM | POA: Diagnosis not present

## 2014-02-01 DIAGNOSIS — Z1889 Other specified retained foreign body fragments: Secondary | ICD-10-CM | POA: Diagnosis not present

## 2014-02-01 DIAGNOSIS — Z79899 Other long term (current) drug therapy: Secondary | ICD-10-CM | POA: Diagnosis not present

## 2014-02-01 DIAGNOSIS — J45909 Unspecified asthma, uncomplicated: Secondary | ICD-10-CM | POA: Diagnosis not present

## 2014-02-01 DIAGNOSIS — G8928 Other chronic postprocedural pain: Secondary | ICD-10-CM | POA: Diagnosis not present

## 2014-02-08 DIAGNOSIS — F209 Schizophrenia, unspecified: Secondary | ICD-10-CM | POA: Diagnosis not present

## 2014-03-30 DIAGNOSIS — R000 Tachycardia, unspecified: Secondary | ICD-10-CM | POA: Diagnosis not present

## 2014-03-30 DIAGNOSIS — E669 Obesity, unspecified: Secondary | ICD-10-CM | POA: Diagnosis not present

## 2014-04-07 DIAGNOSIS — R002 Palpitations: Secondary | ICD-10-CM | POA: Diagnosis not present

## 2014-04-20 DIAGNOSIS — R002 Palpitations: Secondary | ICD-10-CM | POA: Diagnosis not present

## 2014-04-20 DIAGNOSIS — R012 Other cardiac sounds: Secondary | ICD-10-CM | POA: Diagnosis not present

## 2014-04-22 DIAGNOSIS — R002 Palpitations: Secondary | ICD-10-CM | POA: Diagnosis not present

## 2014-05-10 DIAGNOSIS — F209 Schizophrenia, unspecified: Secondary | ICD-10-CM | POA: Diagnosis not present

## 2014-06-11 DIAGNOSIS — J189 Pneumonia, unspecified organism: Secondary | ICD-10-CM | POA: Diagnosis not present

## 2014-06-21 DIAGNOSIS — J189 Pneumonia, unspecified organism: Secondary | ICD-10-CM | POA: Diagnosis not present

## 2014-06-28 DIAGNOSIS — R197 Diarrhea, unspecified: Secondary | ICD-10-CM | POA: Diagnosis not present

## 2014-06-28 DIAGNOSIS — T3691XA Poisoning by unspecified systemic antibiotic, accidental (unintentional), initial encounter: Secondary | ICD-10-CM | POA: Diagnosis not present

## 2014-06-28 DIAGNOSIS — T3695XA Adverse effect of unspecified systemic antibiotic, initial encounter: Secondary | ICD-10-CM | POA: Diagnosis not present

## 2014-06-28 DIAGNOSIS — K521 Toxic gastroenteritis and colitis: Secondary | ICD-10-CM | POA: Diagnosis not present

## 2014-07-08 DIAGNOSIS — Z79899 Other long term (current) drug therapy: Secondary | ICD-10-CM | POA: Diagnosis not present

## 2014-07-08 DIAGNOSIS — Z720 Tobacco use: Secondary | ICD-10-CM | POA: Diagnosis not present

## 2014-07-08 DIAGNOSIS — D72829 Elevated white blood cell count, unspecified: Secondary | ICD-10-CM | POA: Diagnosis not present

## 2014-07-08 DIAGNOSIS — E780 Pure hypercholesterolemia: Secondary | ICD-10-CM | POA: Diagnosis not present

## 2014-07-08 DIAGNOSIS — Z716 Tobacco abuse counseling: Secondary | ICD-10-CM | POA: Diagnosis not present

## 2014-07-21 DIAGNOSIS — J309 Allergic rhinitis, unspecified: Secondary | ICD-10-CM | POA: Diagnosis not present

## 2014-07-21 DIAGNOSIS — J4540 Moderate persistent asthma, uncomplicated: Secondary | ICD-10-CM | POA: Diagnosis not present

## 2014-08-09 DIAGNOSIS — F209 Schizophrenia, unspecified: Secondary | ICD-10-CM | POA: Diagnosis not present

## 2014-09-02 DIAGNOSIS — M79604 Pain in right leg: Secondary | ICD-10-CM | POA: Diagnosis not present

## 2014-09-02 DIAGNOSIS — M79605 Pain in left leg: Secondary | ICD-10-CM | POA: Diagnosis not present

## 2014-09-03 DIAGNOSIS — G8929 Other chronic pain: Secondary | ICD-10-CM | POA: Diagnosis not present

## 2014-09-03 DIAGNOSIS — M79605 Pain in left leg: Secondary | ICD-10-CM | POA: Diagnosis not present

## 2014-09-03 DIAGNOSIS — E119 Type 2 diabetes mellitus without complications: Secondary | ICD-10-CM | POA: Diagnosis not present

## 2014-09-03 DIAGNOSIS — M79604 Pain in right leg: Secondary | ICD-10-CM | POA: Diagnosis not present

## 2014-09-16 DIAGNOSIS — M79605 Pain in left leg: Secondary | ICD-10-CM | POA: Diagnosis not present

## 2014-09-16 DIAGNOSIS — M79604 Pain in right leg: Secondary | ICD-10-CM | POA: Diagnosis not present

## 2014-09-20 DIAGNOSIS — E119 Type 2 diabetes mellitus without complications: Secondary | ICD-10-CM | POA: Diagnosis not present

## 2014-09-20 DIAGNOSIS — M79605 Pain in left leg: Secondary | ICD-10-CM | POA: Diagnosis not present

## 2014-09-20 DIAGNOSIS — E780 Pure hypercholesterolemia: Secondary | ICD-10-CM | POA: Diagnosis not present

## 2014-09-20 DIAGNOSIS — M79604 Pain in right leg: Secondary | ICD-10-CM | POA: Diagnosis not present

## 2014-09-22 DIAGNOSIS — M79604 Pain in right leg: Secondary | ICD-10-CM | POA: Diagnosis not present

## 2014-10-04 DIAGNOSIS — F209 Schizophrenia, unspecified: Secondary | ICD-10-CM | POA: Diagnosis not present

## 2014-10-26 DIAGNOSIS — E780 Pure hypercholesterolemia: Secondary | ICD-10-CM | POA: Diagnosis not present

## 2014-10-26 DIAGNOSIS — E119 Type 2 diabetes mellitus without complications: Secondary | ICD-10-CM | POA: Diagnosis not present

## 2014-10-26 DIAGNOSIS — E1169 Type 2 diabetes mellitus with other specified complication: Secondary | ICD-10-CM | POA: Diagnosis not present

## 2014-10-26 DIAGNOSIS — Z79899 Other long term (current) drug therapy: Secondary | ICD-10-CM | POA: Diagnosis not present

## 2014-10-26 DIAGNOSIS — N3000 Acute cystitis without hematuria: Secondary | ICD-10-CM | POA: Diagnosis not present

## 2014-11-01 DIAGNOSIS — J208 Acute bronchitis due to other specified organisms: Secondary | ICD-10-CM | POA: Diagnosis not present

## 2014-11-01 DIAGNOSIS — M5441 Lumbago with sciatica, right side: Secondary | ICD-10-CM | POA: Diagnosis not present

## 2014-11-01 DIAGNOSIS — B9689 Other specified bacterial agents as the cause of diseases classified elsewhere: Secondary | ICD-10-CM | POA: Diagnosis not present

## 2014-11-01 DIAGNOSIS — M5442 Lumbago with sciatica, left side: Secondary | ICD-10-CM | POA: Diagnosis not present

## 2014-12-26 DIAGNOSIS — R300 Dysuria: Secondary | ICD-10-CM | POA: Diagnosis not present

## 2015-01-04 DIAGNOSIS — F209 Schizophrenia, unspecified: Secondary | ICD-10-CM | POA: Diagnosis not present

## 2015-01-14 DIAGNOSIS — J0100 Acute maxillary sinusitis, unspecified: Secondary | ICD-10-CM | POA: Diagnosis not present

## 2015-01-19 ENCOUNTER — Ambulatory Visit (INDEPENDENT_AMBULATORY_CARE_PROVIDER_SITE_OTHER): Payer: Medicare Other | Admitting: Allergy and Immunology

## 2015-01-19 ENCOUNTER — Encounter: Payer: Self-pay | Admitting: Allergy and Immunology

## 2015-01-19 ENCOUNTER — Other Ambulatory Visit: Payer: Self-pay

## 2015-01-19 VITALS — BP 102/58 | HR 80 | Resp 16 | Ht 59.45 in | Wt 150.4 lb

## 2015-01-19 DIAGNOSIS — Z720 Tobacco use: Secondary | ICD-10-CM

## 2015-01-19 DIAGNOSIS — J3089 Other allergic rhinitis: Secondary | ICD-10-CM

## 2015-01-19 DIAGNOSIS — K219 Gastro-esophageal reflux disease without esophagitis: Secondary | ICD-10-CM

## 2015-01-19 DIAGNOSIS — J4540 Moderate persistent asthma, uncomplicated: Secondary | ICD-10-CM

## 2015-01-19 MED ORDER — ALBUTEROL SULFATE 1.25 MG/3ML IN NEBU: 1.0000 | INHALATION_SOLUTION | RESPIRATORY_TRACT | Status: DC | PRN

## 2015-01-19 NOTE — Patient Instructions (Signed)
  1. Advair 230 two puffs two times per day.  2. flonase one spray each nostril two times per day  3. Omeprazole 40 one tablet two times per day  4. Montelukast 10mg  one tablet two times per day  5. If needed:   A. Proair HFA 2 puffs every 4-6 hours  B. Epi-pen  6. Get flu vaccine  7. Decrease caffeine use and tobacco use.  8. Return in four weeks.

## 2015-01-19 NOTE — Progress Notes (Signed)
[facility]  Follow-up Note  Subjective  Carol Vazquez is a 39 y.o. female who returns to the [facility] in re-evaluation of the following:  HPI   Current outpatient prescriptions:  .  albuterol (ACCUNEB) 1.25 MG/3ML nebulizer solution, Take 3 mLs (1.25 mg total) by nebulization as needed for wheezing., Disp: 225 vial, Rfl: 0 .  CALCIUM PO, Take by mouth daily., Disp: , Rfl:  .  FLUoxetine (PROZAC) 20 MG capsule, Take 20 mg by mouth daily., Disp: , Rfl:  .  FluPHENAZine HCl (PROLIXIN PO), Take 5 mg by mouth daily., Disp: , Rfl:  .  fluticasone (VERAMYST) 27.5 MCG/SPRAY nasal spray, Place 2 sprays into the nose daily., Disp: , Rfl:  .  fluticasone-salmeterol (ADVAIR HFA) 230-21 MCG/ACT inhaler, Inhale 2 puffs into the lungs 2 (two) times daily. , Disp: , Rfl:  .  montelukast (SINGULAIR) 10 MG tablet, Take 10 mg by mouth daily., Disp: , Rfl:  .  Multiple Vitamin (MULTIVITAMIN) tablet, Take 1 tablet by mouth daily., Disp: , Rfl:  .  Omega-3 Fatty Acids (FISH OIL PO), Take by mouth., Disp: , Rfl:  .  omeprazole (PRILOSEC) 40 MG capsule, Take 40 mg by mouth 2 (two) times daily., Disp: , Rfl:  .  PREDNISONE, PAK, PO, Take by mouth., Disp: , Rfl:  .  TRAMADOL HCL PO, Take by mouth., Disp: , Rfl:  .  DULoxetine (CYMBALTA) 30 MG capsule, Take 30 mg by mouth daily., Disp: , Rfl:   Meds ordered this encounter  Medications  . DISCONTD: albuterol (ACCUNEB) 1.25 MG/3ML nebulizer solution    Sig: Take 3 mLs (1.25 mg total) by nebulization as needed for wheezing.    Dispense:  75 mL    Refill:  3  . albuterol (ACCUNEB) 1.25 MG/3ML nebulizer solution    Sig: Take 3 mLs (1.25 mg total) by nebulization as needed for wheezing.    Dispense:  225 vial    Refill:  0    Past Medical History  Diagnosis Date  . Asthma     No past surgical history on file.  Allergies  Allergen Reactions  . Ivp Dye [Iodinated Diagnostic  Agents] Shortness Of Breath  . Mobic [Meloxicam] Shortness Of Breath and Swelling  . Aleve [Naproxen Sodium]     unknown    Review of Systems  Constitutional: Negative for fever, chills, weight loss and malaise/fatigue.  HENT: Positive for congestion. Negative for ear pain, hearing loss, nosebleeds, sore throat and tinnitus.   Eyes: Negative for blurred vision, discharge and redness.  Respiratory: Positive for cough, shortness of breath and wheezing. Negative for sputum production.   Cardiovascular: Negative for chest pain and leg swelling.  Gastrointestinal: Negative for heartburn, nausea and vomiting.  Skin: Negative for itching and rash.  Neurological: Positive for headaches.     Objective:   Filed Vitals:   01/19/15 1536  BP: 102/58  Pulse: 80  Resp: 16    Physical Exam  Constitutional: She is oriented to person, place, and time and well-developed, well-nourished, and in no distress. No distress.  HENT:  Head: Normocephalic and atraumatic. Head is without right periorbital erythema and without left periorbital erythema.  Right Ear: Tympanic membrane, external ear and ear canal normal. No drainage. No foreign bodies. Tympanic membrane is not injected, not scarred, not perforated, not erythematous, not retracted and not bulging. No middle ear effusion.  Left Ear: Tympanic membrane, external ear and ear  canal normal. No drainage. No foreign bodies. Tympanic membrane is not injected, not scarred, not perforated, not erythematous, not retracted and not bulging.  No middle ear effusion.  Nose: Mucosal edema present. No rhinorrhea, nose lacerations, sinus tenderness, nasal deformity, septal deviation or nasal septal hematoma. No epistaxis.  Mouth/Throat: Oropharynx is clear and moist and mucous membranes are normal. No oropharyngeal exudate, posterior oropharyngeal edema, posterior oropharyngeal erythema or tonsillar abscesses.  Eyes: Conjunctivae and lids are normal. Pupils are  equal, round, and reactive to light. Right eye exhibits no chemosis, no discharge and no exudate. No foreign body present in the right eye. Left eye exhibits no chemosis, no discharge and no exudate. No foreign body present in the left eye. Right conjunctiva is not injected. Right conjunctiva has no hemorrhage. Left conjunctiva is not injected. Left conjunctiva has no hemorrhage. No scleral icterus.  Neck: No JVD present. No tracheal tenderness present. No tracheal deviation present. No thyromegaly present.  Cardiovascular: Normal rate, regular rhythm, S1 normal, S2 normal and normal heart sounds.  Exam reveals no gallop and no friction rub.   No murmur heard. Pulmonary/Chest: Effort normal. No stridor. No respiratory distress. She has wheezes. She has no rhonchi. She has no rales. She exhibits no tenderness.  Abdominal: Normal appearance. There is no splenomegaly or hepatomegaly. There is no rigidity.  Musculoskeletal: She exhibits no edema or tenderness.  Lymphadenopathy:    She has no cervical adenopathy.    She has no axillary adenopathy.  Neurological: She is alert and oriented to person, place, and time. She has normal motor skills.  Skin: No bruising, no petechiae, no purpura and no rash noted. Rash is not macular, not papular, not maculopapular, not nodular, not pustular, not vesicular and not urticarial. She is not diaphoretic. No cyanosis or erythema. No pallor. Nails show no clubbing.  Psychiatric: Mood, affect and judgment normal.    Diagnostics:    Spirometry was performed and demonstrated an FEV1 of 1.59 at 64 % of predicted.  The patient had an Asthma Control Test with the following results: ACT Total Score: 20.    Assessment and Plan:   1. Moderate persistent asthma, uncomplicated   2. Other allergic rhinitis   3. Gastroesophageal reflux disease, esophagitis presence not specified   4. Tobacco abuse     Patient Instructions   1. Advair 230 two puffs two times per  day.  2. flonase one spray each nostril two times per day  3. Omeprazole 40 one tablet two times per day  4. Montelukast 10mg  one tablet two times per day  5. If needed:   A. Proair HFA 2 puffs every 4-6 hours  B. Epi-pen  6. Get flu vaccine  7. Decrease caffeine use and tobacco use.  8. Return in four weeks.     Allena Katz, MD [place]

## 2015-01-31 DIAGNOSIS — E119 Type 2 diabetes mellitus without complications: Secondary | ICD-10-CM | POA: Diagnosis not present

## 2015-01-31 DIAGNOSIS — Z79899 Other long term (current) drug therapy: Secondary | ICD-10-CM | POA: Diagnosis not present

## 2015-01-31 DIAGNOSIS — E7800 Pure hypercholesterolemia, unspecified: Secondary | ICD-10-CM | POA: Diagnosis not present

## 2015-01-31 DIAGNOSIS — Z1389 Encounter for screening for other disorder: Secondary | ICD-10-CM | POA: Diagnosis not present

## 2015-01-31 DIAGNOSIS — E1169 Type 2 diabetes mellitus with other specified complication: Secondary | ICD-10-CM | POA: Diagnosis not present

## 2015-01-31 DIAGNOSIS — R300 Dysuria: Secondary | ICD-10-CM | POA: Diagnosis not present

## 2015-02-09 DIAGNOSIS — M545 Low back pain: Secondary | ICD-10-CM | POA: Diagnosis not present

## 2015-02-09 DIAGNOSIS — N3020 Other chronic cystitis without hematuria: Secondary | ICD-10-CM | POA: Diagnosis not present

## 2015-02-09 DIAGNOSIS — N318 Other neuromuscular dysfunction of bladder: Secondary | ICD-10-CM | POA: Diagnosis not present

## 2015-03-22 DIAGNOSIS — F209 Schizophrenia, unspecified: Secondary | ICD-10-CM | POA: Diagnosis not present

## 2015-03-26 ENCOUNTER — Other Ambulatory Visit: Payer: Self-pay | Admitting: Allergy and Immunology

## 2015-04-07 ENCOUNTER — Other Ambulatory Visit: Payer: Self-pay | Admitting: Allergy and Immunology

## 2015-05-03 DIAGNOSIS — M79604 Pain in right leg: Secondary | ICD-10-CM | POA: Diagnosis not present

## 2015-05-03 DIAGNOSIS — E1169 Type 2 diabetes mellitus with other specified complication: Secondary | ICD-10-CM | POA: Diagnosis not present

## 2015-05-03 DIAGNOSIS — E7800 Pure hypercholesterolemia, unspecified: Secondary | ICD-10-CM | POA: Diagnosis not present

## 2015-05-03 DIAGNOSIS — N3001 Acute cystitis with hematuria: Secondary | ICD-10-CM | POA: Diagnosis not present

## 2015-05-03 DIAGNOSIS — N3000 Acute cystitis without hematuria: Secondary | ICD-10-CM | POA: Diagnosis not present

## 2015-05-03 DIAGNOSIS — M79605 Pain in left leg: Secondary | ICD-10-CM | POA: Diagnosis not present

## 2015-06-10 DIAGNOSIS — Z01419 Encounter for gynecological examination (general) (routine) without abnormal findings: Secondary | ICD-10-CM | POA: Diagnosis not present

## 2015-06-10 DIAGNOSIS — N946 Dysmenorrhea, unspecified: Secondary | ICD-10-CM | POA: Diagnosis not present

## 2015-06-12 ENCOUNTER — Other Ambulatory Visit: Payer: Self-pay | Admitting: Allergy and Immunology

## 2015-06-12 DIAGNOSIS — M79661 Pain in right lower leg: Secondary | ICD-10-CM | POA: Diagnosis not present

## 2015-06-12 DIAGNOSIS — M79669 Pain in unspecified lower leg: Secondary | ICD-10-CM | POA: Diagnosis not present

## 2015-06-12 DIAGNOSIS — M79662 Pain in left lower leg: Secondary | ICD-10-CM | POA: Diagnosis not present

## 2015-06-21 DIAGNOSIS — F209 Schizophrenia, unspecified: Secondary | ICD-10-CM | POA: Diagnosis not present

## 2015-07-14 DIAGNOSIS — Z1211 Encounter for screening for malignant neoplasm of colon: Secondary | ICD-10-CM | POA: Diagnosis not present

## 2015-07-20 ENCOUNTER — Ambulatory Visit (INDEPENDENT_AMBULATORY_CARE_PROVIDER_SITE_OTHER): Payer: Medicare Other | Admitting: Allergy and Immunology

## 2015-07-20 ENCOUNTER — Other Ambulatory Visit: Payer: Self-pay

## 2015-07-20 ENCOUNTER — Encounter: Payer: Self-pay | Admitting: Allergy and Immunology

## 2015-07-20 VITALS — BP 98/85 | HR 92 | Resp 16

## 2015-07-20 DIAGNOSIS — Z720 Tobacco use: Secondary | ICD-10-CM | POA: Diagnosis not present

## 2015-07-20 DIAGNOSIS — J4540 Moderate persistent asthma, uncomplicated: Secondary | ICD-10-CM

## 2015-07-20 DIAGNOSIS — K219 Gastro-esophageal reflux disease without esophagitis: Secondary | ICD-10-CM

## 2015-07-20 DIAGNOSIS — J3089 Other allergic rhinitis: Secondary | ICD-10-CM

## 2015-07-20 MED ORDER — TIOTROPIUM BROMIDE MONOHYDRATE 18 MCG IN CAPS: ORAL_CAPSULE | RESPIRATORY_TRACT | Status: DC

## 2015-07-20 MED ORDER — ALBUTEROL SULFATE 1.25 MG/3ML IN NEBU: 1.0000 | INHALATION_SOLUTION | RESPIRATORY_TRACT | Status: DC | PRN

## 2015-07-20 MED ORDER — CETIRIZINE HCL 10 MG PO TABS: 10.0000 mg | ORAL_TABLET | Freq: Every day | ORAL | Status: DC

## 2015-07-20 MED ORDER — BEPOTASTINE BESILATE 1.5 % OP SOLN: 1.0000 [drp] | Freq: Two times a day (BID) | OPHTHALMIC | Status: DC | PRN

## 2015-07-20 MED ORDER — ALBUTEROL SULFATE (2.5 MG/3ML) 0.083% IN NEBU: 2.5000 mg | INHALATION_SOLUTION | RESPIRATORY_TRACT | Status: DC | PRN

## 2015-07-20 NOTE — Progress Notes (Signed)
Follow-up Note  Referring Provider: Carlus Pavlov, MD Primary Provider: Christa See, MD Date of Office Visit: 07/20/2015  Subjective:   Carol Vazquez (DOB: [birth year]) is a 40 y.o. female who returns to the [facility] and [place] on 07/20/2015 in re-evaluation of the following:  HPI Comments: Carol Vazquez returns to this clinic in reevaluation of her asthma and allergic rhinoconjunctivitis and reflux. She is about the same at this point in time although maybe she's had a little bit more cough and wheezing recently. Her bronchodilator requirement is about twice a week. She continues to use her Advair every day.   Her nose is been doing okay although she's had some problems with nasal congestion and as well has had itchy eyes recently. She does continue to use her Flonase on a regular basis.  Her reflux is been under excellent control while using her omeprazole twice a day.  She continues to smoke 1-2 packs of cigarettes per day. She tried nicotine patches and she tried nicotine gum but neither one of these worked very well.     Medication List           [place] [id number] MCG/ACT inhaler  Generic drug:  fluticasone-salmeterol  USE 2 INHALATIONS TWICE DAILY     albuterol 1.25 MG/3ML nebulizer solution  Commonly known as:  ACCUNEB  Take 3 mLs (1.25 mg total) by nebulization as needed for wheezing.     CALCIUM PO  Take by mouth daily.     DULoxetine 30 MG capsule  Commonly known as:  CYMBALTA  Take 30 mg by mouth daily. Reported on 07/20/2015     FISH OIL PO  Take by mouth.     FLUoxetine 20 MG capsule  Commonly known as:  PROZAC  Take 20 mg by mouth daily.     fluticasone 50 MCG/ACT nasal spray  Commonly known as:  FLONASE  USE 1 SPRAY IN EACH NOSTRIL TWICE A DAY FOR STUFFY NOSE OR DRAINAGE     montelukast 10 MG tablet  Commonly known as:  SINGULAIR  TAKE 1 TABLET DAILY AS DIRECTED     multivitamin tablet  Take 1 tablet by mouth daily.     omeprazole 40  MG capsule  Commonly known as:  PRILOSEC  Take 40 mg by mouth 2 (two) times daily.     PROLIXIN PO  Take 5 mg by mouth daily.     TRAMADOL HCL PO  Take by mouth.        Past Medical History  Diagnosis Date  . Asthma     History reviewed. No pertinent past surgical history.  Allergies  Allergen Reactions  . Ivp Dye [Iodinated Diagnostic Agents] Shortness Of Breath  . Mobic [Meloxicam] Shortness Of Breath and Swelling  . Aleve [Naproxen Sodium]     unknown  . Penicillins Diarrhea    Review of systems negative except as noted in HPI / PMHx or noted below:  Review of Systems  Constitutional: Negative.   HENT: Negative.   Eyes: Negative.   Respiratory: Negative.   Cardiovascular: Negative.   Gastrointestinal: Negative.   Genitourinary: Negative.   Musculoskeletal: Negative.   Skin: Negative.   Neurological: Negative.   Endo/Heme/Allergies: Negative.   Psychiatric/Behavioral: Negative.      Objective:   Filed Vitals:   07/20/15 1509  BP: 98/85  Pulse: 92  Resp: 16          Physical Exam  Constitutional: She is well-developed, well-nourished, and in no  distress.  HENT:  Head: Normocephalic.  Right Ear: Tympanic membrane, external ear and ear canal normal.  Left Ear: Tympanic membrane, external ear and ear canal normal.  Nose: Nose normal. No mucosal edema or rhinorrhea.  Mouth/Throat: Uvula is midline, oropharynx is clear and moist and mucous membranes are normal. No oropharyngeal exudate.  Eyes: Conjunctivae are normal.  Neck: Trachea normal. No tracheal tenderness present. No tracheal deviation present. No thyromegaly present.  Cardiovascular: Normal rate, regular rhythm, S1 normal, S2 normal and normal heart sounds.   No murmur heard. Pulmonary/Chest: No stridor. No respiratory distress. She has wheezes (Bilateral expiratory wheeze). She has no rales.  Musculoskeletal: She exhibits no edema.  Lymphadenopathy:       Head (right side): No tonsillar  adenopathy present.       Head (left side): No tonsillar adenopathy present.    She has no cervical adenopathy.  Neurological: She is alert. Gait normal.  Skin: No rash noted. She is not diaphoretic. No erythema. Nails show no clubbing.  Psychiatric: Mood and affect normal.    Diagnostics:    Spirometry was performed and demonstrated an FEV1 of 1.41 at 54 % of predicted.  The patient had an Asthma Control Test with the following results: ACT Total Score: 17.    Assessment and Plan:   1. Moderate persistent asthma, uncomplicated   2. Other allergic rhinitis   3. Gastroesophageal reflux disease, esophagitis presence not specified   4. Tobacco abuse     1. Advair 230 two puffs two times per day.  2. flonase one spray each nostril two times per day  3. Omeprazole 40 one tablet two times per day  4. Montelukast 10mg  one tablet two times per day  5. Spiriva handihaler one inhalation one time per day  5. If needed:   A. Proair HFA 2 puffs every 4-6 hours  B. Epi-pen  C. Bepreve one drop each eye two times per day  D. cetirizine 10 mg one tablet one time per day  6. Retry nicotine substitutes to replace tobacco smoke exposure  7. Decrease caffeine use   8. Return in four weeks.  I will add a anticholinergic agent to Carol Vazquez's medical plan and I've encouraged her to retry nicotine substitutes to replace her tobacco smoke exposure and we'll continue to have her use therapy directed against both reflux and inflammation of her respiratory tract as noted above. I will regroup with her in 4 weeks to assess her response to therapy.  Allena Katz, MD [place]

## 2015-07-20 NOTE — Patient Instructions (Addendum)
  1. Advair 230 two puffs two times per day.  2. flonase one spray each nostril two times per day  3. Omeprazole 40 one tablet two times per day  4. Montelukast 10mg  one tablet two times per day  5. Spiriva handihaler one inhalation one time per day  5. If needed:   A. Proair HFA 2 puffs every 4-6 hours  B. Epi-pen  C. Bepreve one drop each eye two times per day  D. cetirizine 10 mg one tablet one time per day  6. Retry nicotine substitutes to replace tobacco smoke exposure  7. Decrease caffeine use   8. Return in four weeks.

## 2015-08-01 DIAGNOSIS — F209 Schizophrenia, unspecified: Secondary | ICD-10-CM | POA: Diagnosis not present

## 2015-08-02 DIAGNOSIS — F17200 Nicotine dependence, unspecified, uncomplicated: Secondary | ICD-10-CM | POA: Diagnosis not present

## 2015-08-02 DIAGNOSIS — E1169 Type 2 diabetes mellitus with other specified complication: Secondary | ICD-10-CM | POA: Diagnosis not present

## 2015-08-02 DIAGNOSIS — E119 Type 2 diabetes mellitus without complications: Secondary | ICD-10-CM | POA: Diagnosis not present

## 2015-08-02 DIAGNOSIS — E785 Hyperlipidemia, unspecified: Secondary | ICD-10-CM | POA: Diagnosis not present

## 2015-08-02 DIAGNOSIS — J40 Bronchitis, not specified as acute or chronic: Secondary | ICD-10-CM | POA: Diagnosis not present

## 2015-08-18 ENCOUNTER — Ambulatory Visit: Payer: Medicare Other | Admitting: Allergy and Immunology

## 2015-08-25 ENCOUNTER — Ambulatory Visit (INDEPENDENT_AMBULATORY_CARE_PROVIDER_SITE_OTHER): Payer: Medicare Other | Admitting: Allergy and Immunology

## 2015-08-25 ENCOUNTER — Encounter: Payer: Self-pay | Admitting: Allergy and Immunology

## 2015-08-25 VITALS — BP 98/60 | HR 104 | Resp 20

## 2015-08-25 DIAGNOSIS — J4540 Moderate persistent asthma, uncomplicated: Secondary | ICD-10-CM

## 2015-08-25 DIAGNOSIS — R110 Nausea: Secondary | ICD-10-CM | POA: Diagnosis not present

## 2015-08-25 DIAGNOSIS — B373 Candidiasis of vulva and vagina: Secondary | ICD-10-CM

## 2015-08-25 DIAGNOSIS — Z720 Tobacco use: Secondary | ICD-10-CM

## 2015-08-25 DIAGNOSIS — R6884 Jaw pain: Secondary | ICD-10-CM

## 2015-08-25 DIAGNOSIS — K219 Gastro-esophageal reflux disease without esophagitis: Secondary | ICD-10-CM

## 2015-08-25 DIAGNOSIS — J3089 Other allergic rhinitis: Secondary | ICD-10-CM | POA: Diagnosis not present

## 2015-08-25 DIAGNOSIS — B3731 Acute candidiasis of vulva and vagina: Secondary | ICD-10-CM

## 2015-08-25 MED ORDER — ALBUTEROL SULFATE (2.5 MG/3ML) 0.083% IN NEBU: INHALATION_SOLUTION | RESPIRATORY_TRACT | Status: DC

## 2015-08-25 MED ORDER — OLOPATADINE HCL 0.2 % OP SOLN: OPHTHALMIC | Status: DC

## 2015-08-25 MED ORDER — FLUCONAZOLE 150 MG PO TABS: ORAL_TABLET | ORAL | Status: DC

## 2015-08-25 MED ORDER — ALBUTEROL SULFATE HFA 108 (90 BASE) MCG/ACT IN AERS: INHALATION_SPRAY | RESPIRATORY_TRACT | Status: DC

## 2015-08-25 NOTE — Patient Instructions (Addendum)
  1. Advair 230 two puffs two times per day.  2. flonase one spray each nostril two times per day  3. Omeprazole 40 one tablet two times per day  4. Montelukast 10mg  one tablet two times per day  5. Spiriva handihaler one inhalation one time per day  5. If needed:   A. Proair HFA 2 puffs every 4-6 hours  B. Epi-pen  C. Pataday one drop each eye two times per day  D. OTC antihistamine one tablet one time per day  6. Retry nicotine substitutes to replace tobacco smoke exposure  7. Decrease caffeine use   8. Diflucan 150 mg one tablet today.  9. Can try OTC peptobismal  10. Return in 12 weeks

## 2015-08-25 NOTE — Progress Notes (Signed)
Follow-up Note  Referring Provider: Street, Sharon Mt, * Primary Provider: Christa See, MD Date of Office Visit: 08/25/2015  Subjective:   Carol Vazquez (DOB: Aug 22, 1975) is a 40 y.o. female who returns to the All[facility] and [place] on 08/25/2015 in re-evaluation of the following:  HPI: Rande returns to this clinic in reevaluation of her asthma, allergic rhinitis, reflux, and tobacco abuse. Since utilizing medical therapy established 07/21/2015 she is much better. Though she still continues to have cough her breathing is much better and she can get more air into her chest. Her nose is really doing quite well. She apparently was treated with an antibiotic soon after her last visit with me for the same symptoms for which she came to get evaluation in clinic.   With the antibiotic that was administered a few weeks back she has developed vaginal candidiasis with itching and burning and mucoid secretion.  She has developed some nausea over the course of the past week. She has no diarrhea.   She developed right jaw pain over the course of the past 3 days. She has not had any trauma nor pain inside her mouth. Most of her pain is confined to an area located in the preauricular region  She did not stop smoking but has been very good about consistently using her medical therapy although her insurance will not pay for Bepreve or cetirizine.    Medication List           [place] [id number] MCG/ACT inhaler  Generic drug:  fluticasone-salmeterol  USE 2 INHALATIONS TWICE DAILY     PROAIR HFA 108 (90 Base) MCG/ACT inhaler  Generic drug:  albuterol  Inhale two puffs every four to six hours as needed for cough or wheeze.     albuterol (2.5 MG/3ML) 0.083% nebulizer solution  Commonly known as:  PROVENTIL  Take 3 mLs (2.5 mg total) by nebulization every 4 (four) hours as needed for wheezing or shortness of breath.     Bepotastine Besilate 1.5 % Soln  Place 1 drop into both  eyes 2 (two) times daily as needed.     CALCIUM PO  Take by mouth daily.     cetirizine 10 MG tablet  Commonly known as:  ZYRTEC  Take 1 tablet (10 mg total) by mouth daily.     DULoxetine 30 MG capsule  Commonly known as:  CYMBALTA  Take 30 mg by mouth daily. Reported on 07/20/2015     EPIPEN 2-PAK 0.3 mg/0.3 mL Soaj injection  Generic drug:  EPINEPHrine  Inject into the muscle. Use as directed for life-threatening allergic reaction.     FISH OIL PO  Take by mouth. Reported on 08/25/2015     FLUoxetine 20 MG capsule  Commonly known as:  PROZAC  Take 20 mg by mouth daily.     fluticasone 50 MCG/ACT nasal spray  Commonly known as:  FLONASE  USE 1 SPRAY IN EACH NOSTRIL TWICE A DAY FOR STUFFY NOSE OR DRAINAGE     montelukast 10 MG tablet  Commonly known as:  SINGULAIR  TAKE 1 TABLET DAILY AS DIRECTED     multivitamin tablet  Take 1 tablet by mouth daily.     omeprazole 40 MG capsule  Commonly known as:  PRILOSEC  Take 40 mg by mouth 2 (two) times daily.     OVER THE COUNTER MEDICATION  Take by mouth daily. Allergy Medication     PROLIXIN PO  Take 5 mg by  mouth daily.     tiotropium 18 MCG inhalation capsule  Commonly known as:  SPIRIVA HANDIHALER  Take 1 puff once daily     TIZANIDINE HCL PO  Take by mouth 3 (three) times daily.     TRAMADOL HCL PO  Take by mouth.        Past Medical History  Diagnosis Date  . Asthma   . GERD (gastroesophageal reflux disease)   . Allergic rhinitis   . Tobacco abuse     History reviewed. No pertinent past surgical history.  Allergies  Allergen Reactions  . Ivp Dye [Iodinated Diagnostic Agents] Shortness Of Breath  . Mobic [Meloxicam] Shortness Of Breath and Swelling  . Aleve [Naproxen Sodium]     unknown  . Penicillins Diarrhea  . Seroquel [Quetiapine Fumarate]     Altered Mental Status    Review of systems negative except as noted in HPI / PMHx or noted below:  Review of Systems  Constitutional: Negative.     HENT: Negative.   Eyes: Negative.   Respiratory: Negative.   Cardiovascular: Negative.   Gastrointestinal: Negative.   Genitourinary: Negative.   Musculoskeletal: Negative.   Skin: Negative.   Neurological: Negative.   Endo/Heme/Allergies: Negative.   Psychiatric/Behavioral: Negative.      Objective:   Filed Vitals:   08/25/15 1533  BP: 98/60  Pulse: 104  Resp: 20          Physical Exam  Constitutional: She is well-developed, well-nourished, and in no distress.  HENT:  Head: Normocephalic.  Right Ear: Tympanic membrane, external ear and ear canal normal.  Left Ear: Tympanic membrane, external ear and ear canal normal.  Nose: Nose normal. No mucosal edema or rhinorrhea.  Mouth/Throat: Uvula is midline, oropharynx is clear and moist and mucous membranes are normal. No oropharyngeal exudate.  Eyes: Conjunctivae are normal.  Neck: Trachea normal. No tracheal tenderness present. No tracheal deviation present. No thyromegaly present.  Cardiovascular: Normal rate, regular rhythm, S1 normal, S2 normal and normal heart sounds.   No murmur heard. Pulmonary/Chest: Breath sounds normal. No stridor. No respiratory distress. She has no wheezes. She has no rales.  Musculoskeletal: She exhibits no edema.  Lymphadenopathy:       Head (right side): No tonsillar adenopathy present.       Head (left side): No tonsillar adenopathy present.    She has no cervical adenopathy.  Neurological: She is alert. Gait normal.  Skin: No rash noted. She is not diaphoretic. No erythema. Nails show no clubbing.  Psychiatric: Mood and affect normal.    Diagnostics:    Spirometry was performed and demonstrated an FEV1 of 1.63 at 63 % of predicted.  The patient had an Asthma Control Test with the following results: ACT Total Score: 16.    Assessment and Plan:   1. Moderate persistent asthma, uncomplicated   2. Other allergic rhinitis   3. Gastroesophageal reflux disease, esophagitis presence not  specified   4. Tobacco abuse   5. Nausea   6. Vaginal candidiasis   7. Jaw pain     1. Advair 230 two puffs two times per day.  2. flonase one spray each nostril two times per day  3. Omeprazole 40 one tablet two times per day  4. Montelukast 10mg  one tablet two times per day  5. Spiriva handihaler one inhalation one time per day  5. If needed:   A. Proair HFA 2 puffs every 4-6 hours  B. Epi-pen  C. Pataday one drop  each eye two times per day  D. OTC antihistamine one tablet one time per day  6. Retry nicotine substitutes to replace tobacco smoke exposure  7. Decrease caffeine use   8. Diflucan 150 mg one tablet today.  9. Can try OTC peptobismal  10. Return in 12 weeks  [place]'s respiratory tract appears to be doing relatively well at this point in time and I will continue to have her use the therapy mentioned above. She is developed an episode of vaginal candidiasis for which I will give her a single dose of Diflucan. I told her that she cannot use any more Diflucan than a single dose given the fact that she is using Cymbalta every day. I once again encouraged her to decrease her nicotine and caffeine use. For her nausea she can try over-the-counter Pepto-Bismol and if her nausea continues pass another week then she also needs further evaluation. Likewise, I will assume that her jaw issue will improve over the course of the next week or so but if not she'll certainly require further evaluation. I'll see her back in this clinic in approximately 12 weeks.  Allena Katz, MD [place]

## 2015-09-29 DIAGNOSIS — N760 Acute vaginitis: Secondary | ICD-10-CM | POA: Diagnosis not present

## 2015-09-29 DIAGNOSIS — S40861A Insect bite (nonvenomous) of right upper arm, initial encounter: Secondary | ICD-10-CM | POA: Diagnosis not present

## 2015-10-31 DIAGNOSIS — F209 Schizophrenia, unspecified: Secondary | ICD-10-CM | POA: Diagnosis not present

## 2015-11-10 DIAGNOSIS — G4762 Sleep related leg cramps: Secondary | ICD-10-CM | POA: Diagnosis not present

## 2015-11-17 ENCOUNTER — Ambulatory Visit: Payer: Medicare Other | Admitting: Allergy and Immunology

## 2015-11-25 DIAGNOSIS — J209 Acute bronchitis, unspecified: Secondary | ICD-10-CM | POA: Diagnosis not present

## 2015-11-25 DIAGNOSIS — J0100 Acute maxillary sinusitis, unspecified: Secondary | ICD-10-CM | POA: Diagnosis not present

## 2015-11-25 DIAGNOSIS — H66009 Acute suppurative otitis media without spontaneous rupture of ear drum, unspecified ear: Secondary | ICD-10-CM | POA: Diagnosis not present

## 2015-12-05 ENCOUNTER — Ambulatory Visit: Payer: Medicare Other | Admitting: Allergy and Immunology

## 2015-12-06 DIAGNOSIS — E785 Hyperlipidemia, unspecified: Secondary | ICD-10-CM | POA: Diagnosis not present

## 2015-12-06 DIAGNOSIS — F17200 Nicotine dependence, unspecified, uncomplicated: Secondary | ICD-10-CM | POA: Diagnosis not present

## 2015-12-06 DIAGNOSIS — E7800 Pure hypercholesterolemia, unspecified: Secondary | ICD-10-CM | POA: Diagnosis not present

## 2015-12-06 DIAGNOSIS — Z23 Encounter for immunization: Secondary | ICD-10-CM | POA: Diagnosis not present

## 2015-12-06 DIAGNOSIS — E119 Type 2 diabetes mellitus without complications: Secondary | ICD-10-CM | POA: Diagnosis not present

## 2015-12-06 DIAGNOSIS — E1169 Type 2 diabetes mellitus with other specified complication: Secondary | ICD-10-CM | POA: Diagnosis not present

## 2015-12-07 DIAGNOSIS — T63481A Toxic effect of venom of other arthropod, accidental (unintentional), initial encounter: Secondary | ICD-10-CM | POA: Diagnosis not present

## 2015-12-07 DIAGNOSIS — T63461A Toxic effect of venom of wasps, accidental (unintentional), initial encounter: Secondary | ICD-10-CM | POA: Diagnosis not present

## 2016-01-02 ENCOUNTER — Encounter: Payer: Self-pay | Admitting: Allergy and Immunology

## 2016-01-02 ENCOUNTER — Other Ambulatory Visit: Payer: Self-pay

## 2016-01-02 ENCOUNTER — Ambulatory Visit (INDEPENDENT_AMBULATORY_CARE_PROVIDER_SITE_OTHER): Payer: Medicare Other | Admitting: Allergy and Immunology

## 2016-01-02 VITALS — BP 132/78 | HR 92 | Resp 20

## 2016-01-02 DIAGNOSIS — M79605 Pain in left leg: Secondary | ICD-10-CM

## 2016-01-02 DIAGNOSIS — M79604 Pain in right leg: Secondary | ICD-10-CM | POA: Diagnosis not present

## 2016-01-02 DIAGNOSIS — J4540 Moderate persistent asthma, uncomplicated: Secondary | ICD-10-CM

## 2016-01-02 DIAGNOSIS — Z720 Tobacco use: Secondary | ICD-10-CM | POA: Diagnosis not present

## 2016-01-02 DIAGNOSIS — K219 Gastro-esophageal reflux disease without esophagitis: Secondary | ICD-10-CM

## 2016-01-02 DIAGNOSIS — J3089 Other allergic rhinitis: Secondary | ICD-10-CM

## 2016-01-02 MED ORDER — OMEPRAZOLE 40 MG PO CPDR: 40.0000 mg | DELAYED_RELEASE_CAPSULE | Freq: Two times a day (BID) | ORAL | 3 refills | Status: DC

## 2016-01-02 MED ORDER — ALBUTEROL SULFATE (2.5 MG/3ML) 0.083% IN NEBU: INHALATION_SOLUTION | RESPIRATORY_TRACT | 0 refills | Status: DC

## 2016-01-02 MED ORDER — OLOPATADINE HCL 0.2 % OP SOLN: OPHTHALMIC | 1 refills | Status: DC

## 2016-01-02 MED ORDER — ALBUTEROL SULFATE HFA 108 (90 BASE) MCG/ACT IN AERS: INHALATION_SPRAY | RESPIRATORY_TRACT | 0 refills | Status: DC

## 2016-01-02 MED ORDER — FLUTICASONE PROPIONATE 50 MCG/ACT NA SUSP: NASAL | 3 refills | Status: DC

## 2016-01-02 MED ORDER — MONTELUKAST SODIUM 10 MG PO TABS: 10.0000 mg | ORAL_TABLET | Freq: Every day | ORAL | 3 refills | Status: DC

## 2016-01-02 MED ORDER — TIOTROPIUM BROMIDE MONOHYDRATE 18 MCG IN CAPS: ORAL_CAPSULE | RESPIRATORY_TRACT | 3 refills | Status: DC

## 2016-01-02 NOTE — Patient Instructions (Addendum)
  1. Continue Advair 230 two puffs two times per day.  2. Continue flonase one spray each nostril two times per day  3. Continue Omeprazole 40 one tablet two times per day  4. Continue Montelukast 10mg  one tablet two times per day  5. Continue Spiriva handihaler one inhalation one time per day  5. If needed:   A. Proair HFA 2 puffs every 4-6 hours  B. Epi-pen  C. Pataday one drop each eye 1-2 times per day  D. OTC antihistamine one tablet one time per day  6. Continue to Retry nicotine substitutes to replace tobacco smoke exposure  7. Continue to try to Decrease caffeine use   8. Return in 12 weeks  9. Consider evaluation with neurologist in evaluation of posterior thigh and calf pain

## 2016-01-02 NOTE — Progress Notes (Signed)
Follow-up Note  Referring Provider: Street, Sharon Mt, * Primary Provider: Christa See, MD Date of Office Visit: 01/02/2016  Subjective:   Carol Vazquez (DOB: 1976/02/15) is a 40 y.o. female who returns to the All[facility] and [place] on 01/02/2016 in re-evaluation of the following:  HPI: Anaria presents to this clinic in reevaluation of her asthma and allergic rhinitis and reflux and tobacco use and caffeine use. I've not seen him in his clinic since May 2017.  Her respiratory tract is about the same. She still hasn't some intermittent coughing and wheezing and still continues to smoke approximately 1 pack per day. It does not sound as though she has been treated with a systemic steroid for an exacerbation of an asthma during the interval and she does not use any short acting bronchodilator.  Her reflux is not bothering her at this point in time. She still continues to drink about 6 caffeinated drinks per day and does continue on a proton pump inhibitor.  She's had very little problems with her nose although her eyes are little bit itchy. She has not required antibiotic for an episode of sinusitis.  She was stung on her left knee by a wasp about 2 weeks ago and developed a very large local reaction without any associated systemic or constitutional symptoms for which she was given prednisone and an EpiPen at the emergency room.    Medication List      [place] [id number] MCG/ACT inhaler Generic drug:  fluticasone-salmeterol USE 2 INHALATIONS TWICE DAILY   albuterol 108 (90 Base) MCG/ACT inhaler Commonly known as:  PROAIR HFA Inhale two puffs every four to six hours as needed for cough or wheeze.   albuterol (2.5 MG/3ML) 0.083% nebulizer solution Commonly known as:  PROVENTIL Use one vial in nebulizer every four to six hours as needed for cough or wheeze.   CALCIUM PO Take by mouth daily.   FISH OIL PO Take by mouth. Reported on 08/25/2015   FLUARIX  QUADRIVALENT 0.5 ML injection Generic drug:  Influenza vac split quadrivalent PF inject 0.5 milliliter intramuscularly   FLUoxetine 20 MG capsule Commonly known as:  PROZAC Take 20 mg by mouth daily.   fluticasone 50 MCG/ACT nasal spray Commonly known as:  FLONASE USE 1 SPRAY IN EACH NOSTRIL TWICE A DAY FOR STUFFY NOSE OR DRAINAGE   GEODON 20 MG capsule Generic drug:  ziprasidone Take 20 mg by mouth 2 (two) times daily with a meal.   montelukast 10 MG tablet Commonly known as:  SINGULAIR TAKE 1 TABLET DAILY AS DIRECTED   multivitamin tablet Take 1 tablet by mouth daily.   Olopatadine HCl 0.2 % Soln Commonly known as:  PATADAY Can use one drop in each eye once daily as needed for itchy eyes.   omeprazole 40 MG capsule Commonly known as:  PRILOSEC Take 40 mg by mouth 2 (two) times daily.   OVER THE COUNTER MEDICATION Take by mouth daily. Allergy Medication   PROLIXIN PO Take 5 mg by mouth daily.   tiotropium 18 MCG inhalation capsule Commonly known as:  SPIRIVA HANDIHALER Take 1 puff once daily   TIZANIDINE HCL PO Take by mouth 3 (three) times daily.   TRAMADOL HCL PO Take by mouth.       Past Medical History:  Diagnosis Date  . Allergic rhinitis   . Asthma   . GERD (gastroesophageal reflux disease)   . Tobacco abuse     No past surgical history on  file.  Allergies  Allergen Reactions  . Ivp Dye [Iodinated Diagnostic Agents] Shortness Of Breath  . Mobic [Meloxicam] Shortness Of Breath and Swelling  . Aleve [Naproxen Sodium]     Other reaction(s): GI Upset (intolerance) unknown unknown  . Penicillins Diarrhea  . Seroquel [Quetiapine Fumarate]     Altered Mental Status    Review of systems negative except as noted in HPI / PMHx or noted below:  Review of Systems  Constitutional: Negative.   HENT: Negative.   Eyes: Negative.   Respiratory: Negative.   Cardiovascular: Negative.   Gastrointestinal: Negative.   Genitourinary: Negative.     Musculoskeletal: Negative.   Skin: Negative.   Neurological: Negative.        Problems with pain posterior thighs bilaterally and bilateral posterior shin. 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 Never seen a neurologist.  Endo/Heme/Allergies: Negative.   Psychiatric/Behavioral: Negative.      Objective:   Vitals:   01/02/16 1650  BP: 132/78  Pulse: 92  Resp: 20          Physical Exam  Constitutional: She is well-developed, well-nourished, and in no distress.  HENT:  Head: Normocephalic.  Right Ear: Tympanic membrane, external ear and ear canal normal.  Left Ear: Tympanic membrane, external ear and ear canal normal.  Nose: Nose normal. No mucosal edema or rhinorrhea.  Mouth/Throat: Uvula is midline, oropharynx is clear and moist and mucous membranes are normal. No oropharyngeal exudate.  Eyes: Conjunctivae are normal.  Neck: Trachea normal. No tracheal tenderness present. No tracheal deviation present. No thyromegaly present.  Cardiovascular: Normal rate, regular rhythm, S1 normal, S2 normal and normal heart sounds.   No murmur heard. Pulmonary/Chest: Breath sounds normal. No stridor. No respiratory distress. She has no wheezes. She has no rales.  Musculoskeletal: She exhibits no edema.  Lymphadenopathy:       Head (right side): No tonsillar adenopathy present.       Head (left side): No tonsillar adenopathy present.    She has no cervical adenopathy.  Neurological: She is alert. Gait normal.  Skin: No rash noted. She is not diaphoretic. No erythema. Nails show no clubbing.  Psychiatric: Mood and affect normal.    Diagnostics:    Spirometry was performed and demonstrated an FEV1 of 1.72 at 67 % of predicted.  The patient had an Asthma Control Test with the following results: ACT Total Score: 20.    Assessment and Plan:   1. Moderate persistent asthma, uncomplicated   2. Other allergic rhinitis   3.  Gastroesophageal reflux disease, esophagitis presence not specified   4. Tobacco abuse   5. Leg pain, bilateral     1. Continue Advair 230 two puffs two times per day.  2. Continue flonase one spray each nostril two times per day  3. Continue Omeprazole 40 one tablet two times per day  4. Continue Montelukast 10mg  one tablet two times per day  5. Continue Spiriva handihaler one inhalation one time per day  5. If needed:   A. Proair HFA 2 puffs every 4-6 hours  B. Epi-pen  C. Pataday one drop each eye 1-2 times per day  D. OTC antihistamine one tablet one time per day  6. Continue to Retry nicotine substitutes to replace tobacco smoke exposure  7. Continue to try to Decrease caffeine use   8. Return in 12 weeks  9. Consider evaluation with the neurologist to evaluate posterior thigh and calf pain  Kenney Houseman is doing okay from a respiratory standpoint although certainly she would do much better if she stops smoking and I once again had a talk with her today about retrying nicotine substitutes to replace her tobacco smoke exposure. She'll continue on anti-inflammatory medications including Advair and Flonase and montelukast and Spiriva at this point and she'll continue to treat her reflux is noted above. I also informed her that she could probably use less reflux medications if she tapered down her caffeine use. In addition, I think it would be worthwhile for her to consider evaluation by a neurologist to see what the etiology of this posterior thigh and calf pain bilaterally is all about. I will pass on this recommendation to her primary care physician.  Allena Katz, MD [place]

## 2016-01-10 DIAGNOSIS — N945 Secondary dysmenorrhea: Secondary | ICD-10-CM | POA: Diagnosis not present

## 2016-01-10 DIAGNOSIS — Z1239 Encounter for other screening for malignant neoplasm of breast: Secondary | ICD-10-CM | POA: Diagnosis not present

## 2016-01-17 DIAGNOSIS — Z3009 Encounter for other general counseling and advice on contraception: Secondary | ICD-10-CM | POA: Diagnosis not present

## 2016-01-17 DIAGNOSIS — N945 Secondary dysmenorrhea: Secondary | ICD-10-CM | POA: Diagnosis not present

## 2016-01-23 DIAGNOSIS — F209 Schizophrenia, unspecified: Secondary | ICD-10-CM | POA: Diagnosis not present

## 2016-02-13 DIAGNOSIS — R300 Dysuria: Secondary | ICD-10-CM | POA: Diagnosis not present

## 2016-02-21 DIAGNOSIS — E119 Type 2 diabetes mellitus without complications: Secondary | ICD-10-CM | POA: Diagnosis not present

## 2016-04-02 ENCOUNTER — Ambulatory Visit (INDEPENDENT_AMBULATORY_CARE_PROVIDER_SITE_OTHER): Payer: Medicare Other | Admitting: Allergy and Immunology

## 2016-04-02 ENCOUNTER — Encounter: Payer: Self-pay | Admitting: Allergy and Immunology

## 2016-04-02 VITALS — BP 122/88 | HR 88 | Resp 20

## 2016-04-02 DIAGNOSIS — J4551 Severe persistent asthma with (acute) exacerbation: Secondary | ICD-10-CM | POA: Diagnosis not present

## 2016-04-02 DIAGNOSIS — K219 Gastro-esophageal reflux disease without esophagitis: Secondary | ICD-10-CM | POA: Diagnosis not present

## 2016-04-02 DIAGNOSIS — J3089 Other allergic rhinitis: Secondary | ICD-10-CM | POA: Diagnosis not present

## 2016-04-02 DIAGNOSIS — Z720 Tobacco use: Secondary | ICD-10-CM

## 2016-04-02 MED ORDER — METHYLPREDNISOLONE ACETATE 80 MG/ML IJ SUSP
80.0000 mg | Freq: Once | INTRAMUSCULAR | Status: AC
  Administered 2016-04-02: 80 mg via INTRAMUSCULAR

## 2016-04-02 MED ORDER — TIOTROPIUM BROMIDE MONOHYDRATE 2.5 MCG/ACT IN AERS: INHALATION_SPRAY | RESPIRATORY_TRACT | 1 refills | Status: DC

## 2016-04-02 NOTE — Patient Instructions (Signed)
  1. Continue Advair 230 two puffs two times per day.  2. Continue flonase one spray each nostril two times per day  3. Continue Omeprazole 40 one tablet two times per day  4. Continue Montelukast 10mg  one tablet two times per day  5. Change Spiriva to respimat 2.5 two inhalation one time per day  5. If needed:   A. Proair HFA 2 puffs every 4-6 hours  B. Epi-pen  C. Pataday one drop each eye 1-2 times per day  D. OTC antihistamine one tablet one time per day  6. Continue to Retry nicotine substitutes to replace tobacco smoke exposure  7. Continue to try to Decrease caffeine use   8. Depomedrol 80 IM delivered in clinic today  9. Return in 12 weeks

## 2016-04-02 NOTE — Progress Notes (Signed)
Follow-up Note  Referring Provider: Street, Sharon Mt, * Primary Provider: Christa See, MD Date of Office Visit: 04/02/2016  Subjective:   Carol Vazquez (DOB: [birth year]) is a 40 y.o. female who returns to the [facility] [place] on 04/02/2016 in re-evaluation of the following:  HPI: Jackquelin presents to this clinic in reevaluation of her asthma and allergic rhinitis and gastroesophageal reflux disease and tobacco abuse and caffeine use. I have not seen in his clinic since September 2017.  Unfortunately over the course of the past 10 days she's developed coughing and occasional green sputum production and some shortness of breath and has been using her bronchodilator extensively. In addition she's had some nasal congestion that is also accompanied by some clear nasal discharge. She's not had any fever or chest pain or anosmia. She does have a dry mouth and she's using some topical wash for her mouth at this point over the course of the past several days.  Prior to this episode she was doing very well and did not require systemic steroid and antibiotic to treat her respiratory tract issue. She thinks that for the most part her nose was doing well. She thinks that her reflux was doing well. She still continues to drink about 9 caffeinated drinks per day. She still continues to smoke 2 packs per day.  Allergies as of 04/02/2016      Reactions   Ivp Dye [iodinated Diagnostic Agents] Shortness Of Breath   Mobic [meloxicam] Shortness Of Breath, Swelling   Aleve [naproxen Sodium]    Other reaction(s): GI Upset (intolerance) unknown unknown   Penicillins Diarrhea   Seroquel [quetiapine Fumarate]    Altered Mental Status      Medication List      ADVAIR HFA 230-21 MCG/ACT inhaler Generic drug:  fluticasone-salmeterol USE 2 INHALATIONS TWICE DAILY   albuterol 108 (90 Base) MCG/ACT inhaler Commonly known as:  PROAIR HFA Inhale two puffs every four to six hours as  needed for cough or wheeze.   albuterol (2.5 MG/3ML) 0.083% nebulizer solution Commonly known as:  PROVENTIL Use one vial in nebulizer every four to six hours as needed for cough or wheeze.   CALCIUM PO Take by mouth daily.   COLLAGEN PO Take by mouth.   FLUARIX QUADRIVALENT 0.5 ML injection Generic drug:  Influenza vac split quadrivalent PF inject 0.5 milliliter intramuscularly   FLUoxetine 20 MG capsule Commonly known as:  PROZAC Take 20 mg by mouth daily.   fluticasone 50 MCG/ACT nasal spray Commonly known as:  FLONASE USE 1 SPRAY IN EACH NOSTRIL TWICE A DAY FOR STUFFY NOSE OR DRAINAGE   GEODON 20 MG capsule Generic drug:  ziprasidone Take 20 mg by mouth 2 (two) times daily with a meal.   montelukast 10 MG tablet Commonly known as:  SINGULAIR Take 1 tablet (10 mg total) by mouth daily. as directed   multivitamin tablet Take 1 tablet by mouth daily.   Olopatadine HCl 0.2 % Soln Commonly known as:  PATADAY Can use one drop in each eye once daily as needed for itchy eyes.   omeprazole 40 MG capsule Commonly known as:  PRILOSEC Take 1 capsule (40 mg total) by mouth 2 (two) times daily.   OVER THE COUNTER MEDICATION Take by mouth daily. Allergy Medication   PROLIXIN PO Take 5 mg by mouth daily.   tiotropium 18 MCG inhalation capsule Commonly known as:  SPIRIVA HANDIHALER Take 1 puff once daily   TIZANIDINE HCL PO  Take by mouth 3 (three) times daily.   TRAMADOL HCL PO Take by mouth.   VITAMIN B-12 PO Take by mouth.       Past Medical History:  Diagnosis Date  . Allergic rhinitis   . Asthma   . GERD (gastroesophageal reflux disease)   . Tobacco abuse     History reviewed. No pertinent surgical history.  Review of systems negative except as noted in HPI / PMHx or noted below:  Review of Systems  Constitutional: Negative.   HENT: Negative.   Eyes: Negative.   Respiratory: Negative.   Cardiovascular: Negative.   Gastrointestinal: Negative.     Genitourinary: Negative.   Musculoskeletal: Negative.   Skin: Negative.   Neurological: Negative.   Endo/Heme/Allergies: Negative.   Psychiatric/Behavioral: Negative.      Objective:   Vitals:   04/02/16 1519  BP: 122/88  Pulse: 88  Resp: 20          Physical Exam  Constitutional: She is well-developed, well-nourished, and in no distress.  HENT:  Head: Normocephalic.  Right Ear: Tympanic membrane, external ear and ear canal normal.  Left Ear: Tympanic membrane, external ear and ear canal normal.  Nose: Nose normal. No mucosal edema or rhinorrhea.  Mouth/Throat: Uvula is midline, oropharynx is clear and moist and mucous membranes are normal. No oropharyngeal exudate.  Eyes: Conjunctivae are normal.  Neck: Trachea normal. No tracheal tenderness present. No tracheal deviation present. No thyromegaly present.  Cardiovascular: Normal rate, regular rhythm, S1 normal, S2 normal and normal heart sounds.   No murmur heard. Pulmonary/Chest: No stridor. No respiratory distress. She has wheezes (end expiratory wheezing posterior lung field). She has no rales.  Musculoskeletal: She exhibits no edema.  Lymphadenopathy:       Head (right side): No tonsillar adenopathy present.       Head (left side): No tonsillar adenopathy present.    She has no cervical adenopathy.  Neurological: She is alert. Gait normal.  Skin: No rash noted. She is not diaphoretic. No erythema. Nails show no clubbing.  Psychiatric: Mood and affect normal.    Diagnostics:    Spirometry was performed and demonstrated an FEV1 of 1.55 at 61 % of predicted.  Assessment and Plan:   1. Asthma, not well controlled, severe persistent, with acute exacerbation   2. Other allergic rhinitis   3. Gastroesophageal reflux disease, esophagitis presence not specified   4. Tobacco abuse     1. Continue Advair 230 two puffs two times per day.  2. Continue flonase one spray each nostril two times per day  3. Continue  Omeprazole 40 one tablet two times per day  4. Continue Montelukast 10mg  one tablet two times per day  5. Change Spiriva to respimat 2.5 two inhalation one time per day  5. If needed:   A. Proair HFA 2 puffs every 4-6 hours  B. Epi-pen  C. Pataday one drop each eye 1-2 times per day  D. OTC antihistamine one tablet one time per day  6. Continue to Retry nicotine substitutes to replace tobacco smoke exposure  7. Continue to try to Decrease caffeine use   8. Depomedrol 80 IM delivered in clinic today  9. Return in 12 weeks   I will assume that he will do well with therapy mentioned above which basically includes systemic steroids in addition to all her other medications directed at respiratory tract inflammation and reflux. She obviously would do much better if she stopped smoking and if she stopped  drinking extensive amounts of caffeine but after another discussion today about these issues I feel as though she's not going to make any move towards rectifying these issues. She would like to change her Spiriva to a different delivery system and we'll see if we can get that done. I'll see her back in this clinic in 12 weeks or earlier if there is a problem.  Allena Katz, MD [place]

## 2016-04-06 DIAGNOSIS — F17200 Nicotine dependence, unspecified, uncomplicated: Secondary | ICD-10-CM | POA: Diagnosis not present

## 2016-04-06 DIAGNOSIS — N3090 Cystitis, unspecified without hematuria: Secondary | ICD-10-CM | POA: Diagnosis not present

## 2016-04-06 DIAGNOSIS — N3946 Mixed incontinence: Secondary | ICD-10-CM | POA: Diagnosis not present

## 2016-04-06 DIAGNOSIS — N926 Irregular menstruation, unspecified: Secondary | ICD-10-CM | POA: Diagnosis not present

## 2016-04-18 ENCOUNTER — Other Ambulatory Visit: Payer: Self-pay | Admitting: Allergy and Immunology

## 2016-04-23 DIAGNOSIS — F209 Schizophrenia, unspecified: Secondary | ICD-10-CM | POA: Diagnosis not present

## 2016-05-11 DIAGNOSIS — J111 Influenza due to unidentified influenza virus with other respiratory manifestations: Secondary | ICD-10-CM | POA: Diagnosis not present

## 2016-05-29 DIAGNOSIS — R0602 Shortness of breath: Secondary | ICD-10-CM | POA: Diagnosis not present

## 2016-05-29 DIAGNOSIS — R05 Cough: Secondary | ICD-10-CM | POA: Diagnosis not present

## 2016-06-02 DIAGNOSIS — H9203 Otalgia, bilateral: Secondary | ICD-10-CM | POA: Diagnosis not present

## 2016-06-25 ENCOUNTER — Ambulatory Visit: Payer: Medicare Other | Admitting: Allergy and Immunology

## 2016-06-28 ENCOUNTER — Ambulatory Visit (INDEPENDENT_AMBULATORY_CARE_PROVIDER_SITE_OTHER): Payer: Medicare Other | Admitting: Allergy and Immunology

## 2016-06-28 ENCOUNTER — Encounter: Payer: Self-pay | Admitting: Allergy and Immunology

## 2016-06-28 VITALS — BP 122/78 | HR 96 | Resp 18

## 2016-06-28 DIAGNOSIS — J3089 Other allergic rhinitis: Secondary | ICD-10-CM

## 2016-06-28 DIAGNOSIS — Z720 Tobacco use: Secondary | ICD-10-CM

## 2016-06-28 DIAGNOSIS — J4540 Moderate persistent asthma, uncomplicated: Secondary | ICD-10-CM | POA: Diagnosis not present

## 2016-06-28 DIAGNOSIS — K219 Gastro-esophageal reflux disease without esophagitis: Secondary | ICD-10-CM

## 2016-06-28 MED ORDER — AZELASTINE-FLUTICASONE 137-50 MCG/ACT NA SUSP: NASAL | 1 refills | Status: DC

## 2016-06-28 MED ORDER — OMEPRAZOLE 40 MG PO CPDR: 40.0000 mg | DELAYED_RELEASE_CAPSULE | Freq: Two times a day (BID) | ORAL | 1 refills | Status: DC

## 2016-06-28 MED ORDER — ALBUTEROL SULFATE HFA 108 (90 BASE) MCG/ACT IN AERS: INHALATION_SPRAY | RESPIRATORY_TRACT | 0 refills | Status: DC

## 2016-06-28 MED ORDER — TIOTROPIUM BROMIDE MONOHYDRATE 2.5 MCG/ACT IN AERS: INHALATION_SPRAY | RESPIRATORY_TRACT | 1 refills | Status: DC

## 2016-06-28 MED ORDER — EPINEPHRINE 0.3 MG/0.3ML IJ SOAJ: INTRAMUSCULAR | 1 refills | Status: DC

## 2016-06-28 MED ORDER — MONTELUKAST SODIUM 10 MG PO TABS
10.0000 mg | ORAL_TABLET | Freq: Every day | ORAL | 1 refills | Status: DC
Start: 2016-06-28 — End: 2017-03-12

## 2016-06-28 NOTE — Patient Instructions (Addendum)
  1. Continue Advair 230 two puffs two times per day.  2. CHANGE FLONASE TO DYMISTA ONE SPRAY EACH NOSTRIL TWO TIMES PER DAY  3. Continue Omeprazole 40 one tablet two times per day  4. Continue Montelukast 10mg  one tablet one times per day  5. Change Spiriva to respimat 2.5 two inhalation one time per day  5. If needed:   A. Proair HFA 2 puffs every 4-6 hours  B. Epi-pen  C. Pataday one drop each eye 1-2 times per day  D. OTC antihistamine one tablet one time per day  6. Continue to Retry nicotine substitutes to replace tobacco smoke exposure  7. Continue to try to Decrease caffeine use   8. Return in 12 weeks

## 2016-06-28 NOTE — Progress Notes (Signed)
Follow-up Note  Referring Provider: Street, Sharon Mt, * Primary Provider: Christa See, MD Date of Office Visit: 06/28/2016  Subjective:   Carol Vazquez (DOB: [birth year]) is a 41 y.o. female who returns to the [facility] [place] on 06/28/2016 in re-evaluation of the following:  HPI: Alayziah returns to this clinic in reevaluation of her asthma and allergic rhinitis and tobacco abuse and reflux. I've not seen her in this clinic since December 2017.  During the interval she has contracted influenza followed by what sounds like a secondary lung infection either pneumonia or bronchitis and then subsequently an episode of otitis media or externa. Fortunately she is now back to baseline which means that she has some chronic coughing and sputum production and nasal congestion and sneezing. She still continues to smoke extensively averaging out to 2 packs per day. She still has reflux even though she uses omeprazole. She continues to drink at least a 6 pack of caffeinated drinks per day.  Allergies as of 06/28/2016      Reactions   Ivp Dye [iodinated Diagnostic Agents] Shortness Of Breath   Mobic [meloxicam] Shortness Of Breath, Swelling   Aleve [naproxen Sodium]    Other reaction(s): GI Upset (intolerance) unknown unknown   Penicillins Diarrhea   Seroquel [quetiapine Fumarate]    Altered Mental Status      Medication List      ADVAIR HFA 230-21 MCG/ACT inhaler Generic drug:  fluticasone-salmeterol USE 2 INHALATIONS TWICE DAILY   albuterol (2.5 MG/3ML) 0.083% nebulizer solution Commonly known as:  PROVENTIL USE 1 VIAL VIA NEBULIZER EVERY 4 TO 6 HOURS AS NEEDED FOR COUGH OR WHEEZE   PROAIR HFA 108 (90 Base) MCG/ACT inhaler Generic drug:  albuterol USE 2 INHALATIONS EVERY 4 TO 6 HOURS AS NEEDED FOR COUGH OR WHEEZE   CALCIUM PO Take by mouth daily.   COLLAGEN PO Take by mouth.   FLUARIX QUADRIVALENT 0.5 ML injection Generic drug:  Influenza vac split  quadrivalent PF inject 0.5 milliliter intramuscularly   FLUoxetine 20 MG capsule Commonly known as:  PROZAC Take 20 mg by mouth daily.   fluticasone 50 MCG/ACT nasal spray Commonly known as:  FLONASE USE 1 SPRAY IN EACH NOSTRIL TWICE A DAY FOR STUFFY NOSE OR DRAINAGE   GEODON 20 MG capsule Generic drug:  ziprasidone Take 20 mg by mouth 2 (two) times daily with a meal.   montelukast 10 MG tablet Commonly known as:  SINGULAIR Take 1 tablet (10 mg total) by mouth daily. as directed   multivitamin tablet Take 1 tablet by mouth daily.   Olopatadine HCl 0.2 % Soln Commonly known as:  PATADAY Can use one drop in each eye once daily as needed for itchy eyes.   omeprazole 40 MG capsule Commonly known as:  PRILOSEC Take 1 capsule (40 mg total) by mouth 2 (two) times daily.   PROLIXIN PO Take 5 mg by mouth daily.   Tiotropium Bromide Monohydrate 2.5 MCG/ACT Aers Commonly known as:  SPIRIVA RESPIMAT Inhale two puffs once daily as directed   TIZANIDINE HCL PO Take by mouth 3 (three) times daily.   TRAMADOL HCL PO Take by mouth.   VITAMIN B-12 PO Take by mouth.       Past Medical History:  Diagnosis Date  . Allergic rhinitis   . Asthma   . GERD (gastroesophageal reflux disease)   . Tobacco abuse     History reviewed. No pertinent surgical history.  Review of systems negative except  as noted in HPI / PMHx or noted below:  Review of Systems  Constitutional: Negative.   HENT: Negative.   Eyes: Negative.   Respiratory: Negative.   Cardiovascular: Negative.   Gastrointestinal: Negative.   Genitourinary: Negative.   Musculoskeletal: Negative.   Skin: Negative.   Neurological: Negative.   Endo/Heme/Allergies: Negative.   Psychiatric/Behavioral: Negative.      Objective:   Vitals:   06/28/16 1459  BP: 122/78  Pulse: 96  Resp: 18          Physical Exam  Constitutional: She is well-developed, well-nourished, and in no distress.  HENT:  Head:  Normocephalic.  Right Ear: Tympanic membrane, external ear and ear canal normal.  Left Ear: Tympanic membrane, external ear and ear canal normal.  Nose: Mucosal edema present. No rhinorrhea.  Mouth/Throat: Uvula is midline, oropharynx is clear and moist and mucous membranes are normal. No oropharyngeal exudate.  Eyes: Conjunctivae are normal.  Neck: Trachea normal. No tracheal tenderness present. No tracheal deviation present. No thyromegaly present.  Cardiovascular: Normal rate, regular rhythm, S1 normal, S2 normal and normal heart sounds.   No murmur heard. Pulmonary/Chest: Breath sounds normal. No stridor. No respiratory distress. She has no wheezes. She has no rales.  Musculoskeletal: She exhibits no edema.  Lymphadenopathy:       Head (right side): No tonsillar adenopathy present.       Head (left side): No tonsillar adenopathy present.    She has no cervical adenopathy.  Neurological: She is alert. Gait normal.  Skin: No rash noted. She is not diaphoretic. No erythema. Nails show no clubbing.  Psychiatric: Mood and affect normal.    Diagnostics:    Spirometry was performed and demonstrated an FEV1 of 1.51 at 59 % of predicted.   Assessment and Plan:   1. Moderate persistent asthma, uncomplicated   2. Other allergic rhinitis   3. Tobacco abuse   4. Gastroesophageal reflux disease, esophagitis presence not specified     1. Continue Advair 230 two puffs two times per day.  2. CHANGE FLONASE TO DYMISTA ONE SPRAY EACH NOSTRIL TWO TIMES PER DAY  3. Continue Omeprazole 40 one tablet two times per day  4. Continue Montelukast 10mg  one tablet two times per day  5. Change Spiriva to respimat 2.5 two inhalation one time per day  5. If needed:   A. Proair HFA 2 puffs every 4-6 hours  B. Epi-pen  C. Pataday one drop each eye 1-2 times per day  D. OTC antihistamine one tablet one time per day  6. Continue to Retry nicotine substitutes to replace tobacco smoke exposure  7.  Continue to try to Decrease caffeine use   8. Return in 12 weeks  Zendaya appears to have resolved her recent episode of influenza and what sounds like a secondary respiratory tract infection and her recent episode of ear infection and at this point in time we are going to continue to have her use anti-inflammatory agents for her respiratory tract on a regular basis and also have her make an attempt to decrease or maybe eliminate her tobacco hobby. She will now use a combination nasal steroid and nasal antihistamine as noted above. In addition will keep her on therapy for reflux and hopefully get her to decrease her rather extensive caffeine use. I will see her back in this clinic in 12 weeks or earlier if there is a problem.   Allena Katz, MD Allergy / Immunology [place]

## 2016-07-06 DIAGNOSIS — E119 Type 2 diabetes mellitus without complications: Secondary | ICD-10-CM | POA: Diagnosis not present

## 2016-07-06 DIAGNOSIS — F17200 Nicotine dependence, unspecified, uncomplicated: Secondary | ICD-10-CM | POA: Diagnosis not present

## 2016-07-06 DIAGNOSIS — E785 Hyperlipidemia, unspecified: Secondary | ICD-10-CM | POA: Diagnosis not present

## 2016-07-06 DIAGNOSIS — Z79899 Other long term (current) drug therapy: Secondary | ICD-10-CM | POA: Diagnosis not present

## 2016-07-06 DIAGNOSIS — K219 Gastro-esophageal reflux disease without esophagitis: Secondary | ICD-10-CM | POA: Diagnosis not present

## 2016-07-06 DIAGNOSIS — J449 Chronic obstructive pulmonary disease, unspecified: Secondary | ICD-10-CM | POA: Diagnosis not present

## 2016-07-11 DIAGNOSIS — N760 Acute vaginitis: Secondary | ICD-10-CM | POA: Diagnosis not present

## 2016-07-16 DIAGNOSIS — F209 Schizophrenia, unspecified: Secondary | ICD-10-CM | POA: Diagnosis not present

## 2016-08-12 DIAGNOSIS — J209 Acute bronchitis, unspecified: Secondary | ICD-10-CM | POA: Diagnosis not present

## 2016-08-12 DIAGNOSIS — R062 Wheezing: Secondary | ICD-10-CM | POA: Diagnosis not present

## 2016-09-08 DIAGNOSIS — R1084 Generalized abdominal pain: Secondary | ICD-10-CM | POA: Diagnosis not present

## 2016-09-08 DIAGNOSIS — R300 Dysuria: Secondary | ICD-10-CM | POA: Diagnosis not present

## 2016-09-11 ENCOUNTER — Telehealth: Payer: Self-pay | Admitting: *Deleted

## 2016-09-11 MED ORDER — AZELASTINE HCL 137 MCG/SPRAY NA SOLN: 1.0000 | Freq: Two times a day (BID) | NASAL | 0 refills | Status: DC

## 2016-09-11 MED ORDER — FLUTICASONE PROPIONATE 50 MCG/ACT NA SUSP: NASAL | 0 refills | Status: DC

## 2016-09-11 NOTE — Telephone Encounter (Signed)
L/M advising patient of change and [facility]

## 2016-09-11 NOTE — Telephone Encounter (Signed)
Okay she can switch back to a separate combination of nasal fluticasone + nasal azelastine one spray each nostril twice a day

## 2016-09-11 NOTE — Telephone Encounter (Signed)
Carol Vazquez states that [name] is too expensive and doesn't work well. She wants to switch back to Fluticasone. Please advise.

## 2016-09-20 ENCOUNTER — Ambulatory Visit: Payer: Medicare Other | Admitting: Allergy and Immunology

## 2016-10-11 DIAGNOSIS — N926 Irregular menstruation, unspecified: Secondary | ICD-10-CM | POA: Diagnosis not present

## 2016-10-11 DIAGNOSIS — N946 Dysmenorrhea, unspecified: Secondary | ICD-10-CM | POA: Diagnosis not present

## 2016-10-11 DIAGNOSIS — R5381 Other malaise: Secondary | ICD-10-CM | POA: Diagnosis not present

## 2016-10-11 DIAGNOSIS — N925 Other specified irregular menstruation: Secondary | ICD-10-CM | POA: Diagnosis not present

## 2016-10-15 DIAGNOSIS — F209 Schizophrenia, unspecified: Secondary | ICD-10-CM | POA: Diagnosis not present

## 2016-10-18 DIAGNOSIS — F17200 Nicotine dependence, unspecified, uncomplicated: Secondary | ICD-10-CM | POA: Diagnosis not present

## 2016-10-18 DIAGNOSIS — E1169 Type 2 diabetes mellitus with other specified complication: Secondary | ICD-10-CM | POA: Diagnosis not present

## 2016-10-18 DIAGNOSIS — E785 Hyperlipidemia, unspecified: Secondary | ICD-10-CM | POA: Diagnosis not present

## 2016-10-18 DIAGNOSIS — E7800 Pure hypercholesterolemia, unspecified: Secondary | ICD-10-CM | POA: Diagnosis not present

## 2016-10-18 DIAGNOSIS — E119 Type 2 diabetes mellitus without complications: Secondary | ICD-10-CM | POA: Diagnosis not present

## 2016-10-18 DIAGNOSIS — F17210 Nicotine dependence, cigarettes, uncomplicated: Secondary | ICD-10-CM | POA: Diagnosis not present

## 2016-10-18 DIAGNOSIS — K219 Gastro-esophageal reflux disease without esophagitis: Secondary | ICD-10-CM | POA: Diagnosis not present

## 2016-10-18 DIAGNOSIS — Z79899 Other long term (current) drug therapy: Secondary | ICD-10-CM | POA: Diagnosis not present

## 2016-11-01 ENCOUNTER — Encounter: Payer: Self-pay | Admitting: Allergy and Immunology

## 2016-11-01 ENCOUNTER — Ambulatory Visit (INDEPENDENT_AMBULATORY_CARE_PROVIDER_SITE_OTHER): Payer: Medicare Other | Admitting: Allergy and Immunology

## 2016-11-01 VITALS — BP 110/50 | HR 100 | Resp 16

## 2016-11-01 DIAGNOSIS — K219 Gastro-esophageal reflux disease without esophagitis: Secondary | ICD-10-CM

## 2016-11-01 DIAGNOSIS — M7989 Other specified soft tissue disorders: Secondary | ICD-10-CM

## 2016-11-01 DIAGNOSIS — Z720 Tobacco use: Secondary | ICD-10-CM

## 2016-11-01 DIAGNOSIS — J449 Chronic obstructive pulmonary disease, unspecified: Secondary | ICD-10-CM

## 2016-11-01 DIAGNOSIS — J3089 Other allergic rhinitis: Secondary | ICD-10-CM | POA: Diagnosis not present

## 2016-11-01 MED ORDER — AZELASTINE HCL 137 MCG/SPRAY NA SOLN: 1.0000 | Freq: Two times a day (BID) | NASAL | 3 refills | Status: DC

## 2016-11-01 MED ORDER — FLUTICASONE-SALMETEROL 230-21 MCG/ACT IN AERO: INHALATION_SPRAY | RESPIRATORY_TRACT | 3 refills | Status: DC

## 2016-11-01 MED ORDER — CETIRIZINE HCL 10 MG PO TABS: 10.0000 mg | ORAL_TABLET | Freq: Every day | ORAL | 5 refills | Status: DC

## 2016-11-01 NOTE — Patient Instructions (Addendum)
  1. Continue Advair 230 two puffs two times per day.  2. Continue Flonase 1-2 sprays each nostril one time per day   3. Continue Omeprazole 40 one tablet two times per day  4. Continue Montelukast 10mg  one tablet one times per day  5. Continue Spiriva Respimat 2.5 two inhalation one time per day  5. If needed:   A. Proair HFA 2 puffs every 4-6 hours  B. Epi-pen  C. Pataday one drop each eye 1-2 times per day  D. OTC antihistamine one tablet one time per day  E. Azelastine 1-2 sprays each nostril 1-2 times per day  6. Continue to Retry nicotine substitutes to replace tobacco smoke exposure  7. Continue to try to Decrease caffeine use   8. Obtain an 2D ECHO to look at heart function / swelling  9. Review chest xray from February at urgent care and blood tests from [facility]  10. Return in 4 weeks

## 2016-11-01 NOTE — Progress Notes (Addendum)
Follow-up Note  Referring Provider: Street, Sharon Mt, * Primary Provider: Street, Sharon Mt, MD Date of Office Visit: 11/01/2016  Subjective:   Carol Vazquez (DOB: 1976-01-09) is a 41 y.o. female who returns to the [facility] [place] on 11/01/2016 in re-evaluation of the following:  HPI: Torii returns to this clinic in reevaluation of her asthma and allergic rhinitis and tobacco abuse and reflux. Her last visit to this clinic was March 2018.  She still continues to have issues with dyspnea and wheezing and shortness of breath especially if she exerts herself to any degree. She uses a bronchodilator a few times per day on a pretty regular basis. She continues to smoke approximately 1-1/2 packs of cigarettes per day. It does not sound as though she has required any systemic steroids since I have last seen her in this clinic.  Her nose appears to be doing relatively well and does not sound as though she has required an antibiotic to treat an episode of sinusitis.  Her reflux appears to be under very good control at this point in time as long as she continues to use her medications. She still continues to drink approximately 7 Diet Coke per day.  She has developed swelling of her lower extremities over the course of the past month. She did visit with Dr. Venetia Maxon who apparently performed blood test which identified no abnormality. She does not have any leg pain. She has not really had a change in her medications that may be giving rise to this swelling. She is not on a calcium channel blocker.  Allergies as of 11/01/2016      Reactions   Ivp Dye [iodinated Diagnostic Agents] Shortness Of Breath   Mobic [meloxicam] Shortness Of Breath, Swelling   Aleve [naproxen Sodium]    Other reaction(s): GI Upset (intolerance) unknown unknown   Penicillins Diarrhea   Seroquel [quetiapine Fumarate]    Altered Mental Status      Medication List      albuterol (2.5 MG/3ML)  0.083% nebulizer solution Commonly known as:  PROVENTIL USE 1 VIAL VIA NEBULIZER EVERY 4 TO 6 HOURS AS NEEDED FOR COUGH OR WHEEZE   albuterol 108 (90 Base) MCG/ACT inhaler Commonly known as:  PROAIR HFA USE 2 INHALATIONS EVERY 4 TO 6 HOURS AS NEEDED FOR COUGH OR WHEEZE   Azelastine HCl 137 MCG/SPRAY Soln Place 1 spray into the nose 2 (two) times daily.   baclofen 10 MG tablet Commonly known as:  LIORESAL Take 10 mg by mouth 3 (three) times daily.   CALCIUM PO Take by mouth daily.   cetirizine 10 MG tablet Commonly known as:  ZYRTEC Take 1 tablet (10 mg total) by mouth daily.   COLLAGEN PO Take by mouth.   EPINEPHrine 0.3 mg/0.3 mL Soaj injection Commonly known as:  EPIPEN 2-PAK Use as directed for life-threatening allergic reaction.   FLUoxetine 20 MG capsule Commonly known as:  PROZAC Take 20 mg by mouth daily.   fluticasone 50 MCG/ACT nasal spray Commonly known as:  FLONASE USE 1 SPRAY IN EACH NOSTRIL TWICE A DAY FOR STUFFY NOSE OR DRAINAGE   fluticasone-salmeterol 230-21 MCG/ACT inhaler Commonly known as:  ADVAIR HFA USE 2 INHALATIONS TWICE DAILY   GEODON 20 MG capsule Generic drug:  ziprasidone Take 20 mg by mouth 2 (two) times daily with a meal.   montelukast 10 MG tablet Commonly known as:  SINGULAIR Take 1 tablet (10 mg total) by mouth daily.   multivitamin  tablet Take 1 tablet by mouth daily.   Olopatadine HCl 0.2 % Soln Commonly known as:  PATADAY Can use one drop in each eye once daily as needed for itchy eyes.   omeprazole 40 MG capsule Commonly known as:  PRILOSEC Take 1 capsule (40 mg total) by mouth 2 (two) times daily.   PROLIXIN PO Take 5 mg by mouth daily.   Tiotropium Bromide Monohydrate 2.5 MCG/ACT Aers Commonly known as:  SPIRIVA RESPIMAT Inhale two puffs once daily as directed   TRAMADOL HCL PO Take by mouth.   VITAMIN B-12 PO Take by mouth.       Past Medical History:  Diagnosis Date  . Allergic rhinitis   . Asthma     . GERD (gastroesophageal reflux disease)   . Tobacco abuse     History reviewed. No pertinent surgical history.  Review of systems negative except as noted in HPI / PMHx or noted below:  Review of Systems  Constitutional: Negative.   HENT: Negative.   Eyes: Negative.   Respiratory: Negative.   Cardiovascular: Negative.   Gastrointestinal: Negative.   Genitourinary: Negative.   Musculoskeletal: Negative.   Skin: Negative.   Neurological: Negative.   Endo/Heme/Allergies: Negative.   Psychiatric/Behavioral: Negative.      Objective:   Vitals:   11/01/16 1640  BP: (!) 110/50  Pulse: 100  Resp: 16          Physical Exam  Constitutional: She is well-developed, well-nourished, and in no distress.  HENT:  Head: Normocephalic.  Right Ear: Tympanic membrane, external ear and ear canal normal.  Left Ear: Tympanic membrane, external ear and ear canal normal.  Nose: Nose normal. No mucosal edema or rhinorrhea.  Mouth/Throat: Uvula is midline, oropharynx is clear and moist and mucous membranes are normal. No oropharyngeal exudate.  Eyes: Conjunctivae are normal.  Neck: Trachea normal. No tracheal tenderness present. No tracheal deviation present. No thyromegaly present.  Cardiovascular: Normal rate, regular rhythm, S1 normal, S2 normal and normal heart sounds.   No murmur heard. Pulmonary/Chest: Breath sounds normal. No stridor. No respiratory distress. She has no wheezes. She has no rales.  Musculoskeletal: She exhibits edema (pitting edema up to mid shin of both lower extremities).  Lymphadenopathy:       Head (right side): No tonsillar adenopathy present.       Head (left side): No tonsillar adenopathy present.    She has no cervical adenopathy.  Neurological: She is alert. Gait normal.  Skin: No rash noted. She is not diaphoretic. No erythema. Nails show no clubbing.  Psychiatric: Mood and affect normal.    Diagnostics: Oxygen saturation was 95% on room air at rest.  With exercise it dropped to 93%.   Spirometry was performed and demonstrated an FEV1 of 1.64 at 63 % of predicted.  The patient had an Asthma Control Test with the following results: ACT Total Score: 17.    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 Tobacco abuse   3. Other allergic rhinitis   4. Gastroesophageal reflux disease, esophagitis presence not specified   5. Swelling of lower extremity     1. Continue Advair 230 two puffs two times per day.  2. Continue Flonase 1-2 sprays each nostril one time per day   3. Continue Omeprazole 40 one tablet two times per day  4. Continue Montelukast 10mg  one tablet one times per day  5. Continue Spiriva Respimat 2.5 two inhalation one time per day  5. If needed:   A. Proair HFA 2 puffs every 4-6 hours  B. Epi-pen  C. Pataday one drop each eye 1-2 times per day  D. OTC antihistamine one tablet one time per day  E. Azelastine 1-2 sprays each nostril 1-2 times per day  6. Continue to Retry nicotine substitutes to replace tobacco smoke exposure  7. Continue to try to Decrease caffeine use   8. Obtain an 2D ECHO to look at heart function / swelling  9. Review chest xray from February at urgent care and blood tests from [facility]  10. Return in 4 weeks  Melessa appears to be stable regarding her respiratory tract disease although stability is defined by the requirement for a short acting bronchodilator twice a day and a large collection of medications directed at respiratory tract inflammation. In addition, her reflux appears to be doing relatively well at this point even in the face of consuming large amounts of caffeine throughout the day. What is of concern is her lower extremity swelling. I assume that the blood tests that were obtained by Dr. Venetia Maxon identified no evidence of significant left-sided heart failure but I wonder if she does not have a component of cor pulmonale and I think we are obligated to get a 2-D echo to look  at this issue in a little more detail. As well, I will review her previous chest x-ray from February to see if there was any evidence of an abnormality that may account for this lower extremity edema. I will regroup with her in 4 weeks or earlier if there is a problem.  Allena Katz, MD Allergy / Immunology [place]

## 2016-11-05 DIAGNOSIS — R0602 Shortness of breath: Secondary | ICD-10-CM

## 2016-11-05 DIAGNOSIS — R600 Localized edema: Secondary | ICD-10-CM | POA: Diagnosis not present

## 2016-11-06 ENCOUNTER — Telehealth: Payer: Self-pay | Admitting: Allergy and Immunology

## 2016-11-06 NOTE — Telephone Encounter (Signed)
Patient is calling wanting know if her ECHO results are back.

## 2016-11-06 NOTE — Telephone Encounter (Signed)
Called cell numbrer advised not available.  We have not received results yet will contact once we receive them and Dr Neldon Mc is able to review

## 2016-11-07 NOTE — Telephone Encounter (Signed)
Patient called again today about results. Advised that once the results came back someone would give her a call.

## 2016-11-09 ENCOUNTER — Encounter: Payer: Self-pay | Admitting: *Deleted

## 2016-11-12 ENCOUNTER — Telehealth: Payer: Self-pay

## 2016-11-12 NOTE — Telephone Encounter (Signed)
Patient's mother called wanting to know results of Carol Vazquez's ECHO. I informed her of Dr.Kozlow's message (See scanned ECHO results) and that he wants blood tests and cardiology appointment. I have initiated referral with [facility] in [place] ( faxed notes, insurance cards, ECHO results to cardiology office). Awaiting appointment information.  Also I called and left message with PCP requesting recent blood test results to make sure we do not order tests that have already been drawn. Will call patient if we need more blood tests after reviewing PCP's results.

## 2016-11-14 ENCOUNTER — Encounter: Payer: Self-pay | Admitting: *Deleted

## 2016-11-26 ENCOUNTER — Ambulatory Visit (INDEPENDENT_AMBULATORY_CARE_PROVIDER_SITE_OTHER): Payer: Medicare Other | Admitting: Allergy and Immunology

## 2016-11-26 ENCOUNTER — Encounter: Payer: Self-pay | Admitting: Allergy and Immunology

## 2016-11-26 VITALS — BP 118/68 | HR 104 | Resp 20

## 2016-11-26 DIAGNOSIS — M7989 Other specified soft tissue disorders: Secondary | ICD-10-CM | POA: Diagnosis not present

## 2016-11-26 DIAGNOSIS — Z720 Tobacco use: Secondary | ICD-10-CM

## 2016-11-26 DIAGNOSIS — J449 Chronic obstructive pulmonary disease, unspecified: Secondary | ICD-10-CM

## 2016-11-26 DIAGNOSIS — J3089 Other allergic rhinitis: Secondary | ICD-10-CM | POA: Diagnosis not present

## 2016-11-26 DIAGNOSIS — K219 Gastro-esophageal reflux disease without esophagitis: Secondary | ICD-10-CM

## 2016-11-26 MED ORDER — AZELASTINE HCL 137 MCG/SPRAY NA SOLN: 1.0000 | Freq: Two times a day (BID) | NASAL | 1 refills | Status: DC

## 2016-11-26 MED ORDER — FLUTICASONE PROPIONATE 50 MCG/ACT NA SUSP: NASAL | 1 refills | Status: DC

## 2016-11-26 MED ORDER — CETIRIZINE HCL 10 MG PO TABS: 10.0000 mg | ORAL_TABLET | Freq: Every day | ORAL | 1 refills | Status: DC

## 2016-11-26 NOTE — Progress Notes (Signed)
Follow-up Note  Referring Provider: Street, Sharon Mt, * Primary Provider: Street, Sharon Mt, MD Date of Office Visit: 11/26/2016  Subjective:   Carol Vazquez (DOB: [birth year]) is a 41 y.o. female who returns to the [facility] [place] on 11/26/2016 in re-evaluation of the following:  HPI: Ludene returns to this clinic in reevaluation of her asthma and allergic rhinitis and tobacco use and reflux and lower extremity swelling. She was last seen in this clinic in 11/01/2016 at which point in time she was doing relatively well regarding her respiratory tract but had swelling of her lower extremities.  Overall she believes that her breathing is doing relatively well. She does not need to use a bronchodilator other than her standard treatment of a few times a day. She still continues to smoke almost 2 packs of tobacco per day. Her nose has been doing relatively well. Her reflux appears to be doing well even though she consumes 7 diet Cokes per day.  Her lower extremity swelling appears to be the same.  Allergies as of 11/26/2016      Reactions   Ivp Dye [iodinated Diagnostic Agents] Shortness Of Breath   Mobic [meloxicam] Shortness Of Breath, Swelling   Aleve [naproxen Sodium]    Other reaction(s): GI Upset (intolerance) unknown unknown   Penicillins Diarrhea   Seroquel [quetiapine Fumarate]    Altered Mental Status      Medication List      albuterol (2.5 MG/3ML) 0.083% nebulizer solution Commonly known as:  PROVENTIL USE 1 VIAL VIA NEBULIZER EVERY 4 TO 6 HOURS AS NEEDED FOR COUGH OR WHEEZE   albuterol 108 (90 Base) MCG/ACT inhaler Commonly known as:  PROAIR HFA USE 2 INHALATIONS EVERY 4 TO 6 HOURS AS NEEDED FOR COUGH OR WHEEZE   Azelastine HCl 137 MCG/SPRAY Soln Place 1 spray into the nose 2 (two) times daily.   baclofen 10 MG tablet Commonly known as:  LIORESAL Take 10 mg by mouth 3 (three) times daily.   CALCIUM PO Take by mouth daily.     cetirizine 10 MG tablet Commonly known as:  ZYRTEC Take 1 tablet (10 mg total) by mouth daily.   COLLAGEN PO Take by mouth.   EPINEPHrine 0.3 mg/0.3 mL Soaj injection Commonly known as:  EPIPEN 2-PAK Use as directed for life-threatening allergic reaction.   FLUARIX QUADRIVALENT 0.5 ML injection Generic drug:  Influenza vac split quadrivalent PF inject 0.5 milliliter intramuscularly   FLUoxetine 20 MG capsule Commonly known as:  PROZAC Take 20 mg by mouth daily.   fluticasone 50 MCG/ACT nasal spray Commonly known as:  FLONASE USE 1 SPRAY IN EACH NOSTRIL TWICE A DAY FOR STUFFY NOSE OR DRAINAGE   fluticasone-salmeterol 230-21 MCG/ACT inhaler Commonly known as:  ADVAIR HFA USE 2 INHALATIONS TWICE DAILY   GEODON 20 MG capsule Generic drug:  ziprasidone Take 20 mg by mouth 2 (two) times daily with a meal.   montelukast 10 MG tablet Commonly known as:  SINGULAIR Take 1 tablet (10 mg total) by mouth daily.   multivitamin tablet Take 1 tablet by mouth daily.   Olopatadine HCl 0.2 % Soln Commonly known as:  PATADAY Can use one drop in each eye once daily as needed for itchy eyes.   omeprazole 40 MG capsule Commonly known as:  PRILOSEC Take 1 capsule (40 mg total) by mouth 2 (two) times daily.   PROLIXIN PO Take 5 mg by mouth daily.   Tiotropium Bromide Monohydrate 2.5 MCG/ACT Aers  Commonly known as:  SPIRIVA RESPIMAT Inhale two puffs once daily as directed   TRAMADOL HCL PO Take by mouth.   VITAMIN B-12 PO Take by mouth.       Past Medical History:  Diagnosis Date  . Allergic rhinitis   . Asthma   . GERD (gastroesophageal reflux disease)   . Tobacco abuse     History reviewed. No pertinent surgical history.  Review of systems negative except as noted in HPI / PMHx or noted below:  Review of Systems  Constitutional: Negative.   HENT: Negative.   Eyes: Negative.   Respiratory: Negative.   Cardiovascular: Negative.   Gastrointestinal: Negative.    Genitourinary: Negative.   Musculoskeletal: Negative.   Skin: Negative.   Neurological: Negative.   Endo/Heme/Allergies: Negative.   Psychiatric/Behavioral: Negative.      Objective:   Vitals:   11/26/16 1404  BP: 118/68  Pulse: (!) 104  Resp: 20          Physical Exam  Constitutional: She is well-developed, well-nourished, and in no distress.  HENT:  Head: Normocephalic.  Right Ear: Tympanic membrane, external ear and ear canal normal.  Left Ear: Tympanic membrane, external ear and ear canal normal.  Nose: Nose normal. No mucosal edema or rhinorrhea.  Mouth/Throat: Uvula is midline, oropharynx is clear and moist and mucous membranes are normal. No oropharyngeal exudate.  Eyes: Conjunctivae are normal.  Neck: Trachea normal. No tracheal tenderness present. No tracheal deviation present. No thyromegaly present.  Cardiovascular: Normal rate, regular rhythm, S1 normal, S2 normal and normal heart sounds.   No murmur heard. Pulmonary/Chest: Breath sounds normal. No stridor. No respiratory distress. She has no wheezes. She has no rales.  Musculoskeletal: She exhibits edema (slight pitting edema lower extremities around the ankle).  Lymphadenopathy:       Head (right side): No tonsillar adenopathy present.       Head (left side): No tonsillar adenopathy present.    She has no cervical adenopathy.  Neurological: She is alert. Gait normal.  Skin: No rash noted. She is not diaphoretic. No erythema. Nails show no clubbing.  Psychiatric: Mood and affect normal.    Diagnostics: Results of a 2-D echo obtained 11/07/2016 identified diastolic dysfunction.   Spirometry was performed and demonstrated an FEV1 of 1.45 at 55   % of predicted.  The patient had an Asthma Control Test with the following results: ACT Total Score: 14.    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 Tobacco abuse   3. Other allergic rhinitis   4. Gastroesophageal reflux disease, esophagitis  presence not specified   5. Swelling of lower extremity     1. Continue Advair 230 two puffs two times per day.  2. Continue Flonase 1-2 sprays each nostril one time per day   3. Continue Omeprazole 40 one tablet two times per day  4. Continue Montelukast 10mg  one tablet one times per day  5. Continue Spiriva Respimat 2.5 two inhalation one time per day  5. If needed:   A. Proair HFA 2 puffs every 4-6 hours  B. Epi-pen  C. Pataday one drop each eye 1-2 times per day  D. OTC antihistamine one tablet one time per day  E. Azelastine 1-2 sprays each nostril 1-2 times per day  6. Continue to Retry nicotine substitutes to replace tobacco smoke exposure  7. Continue to try to Decrease caffeine use   8. Follow up with cardiologist for diasystolic dysfunction  9. Obtain fall flu vaccine  10. Return in 6 months or earlier if problem  Not much has really change with Marlowe Kays and she will continue to use anti-inflammatory agents for her respiratory tract inflammatory condition and also continue to treat reflux as noted above and follow-up with her cardiologist concerning her diastolic dysfunction. I would not be surprised at all if she has a slight component of cor pulmonale or diastolic dysfunction giving rise to her lower extremity swelling . We will have her cardiologist address this issue. I will see her back in this clinic in 6 months or earlier if there is a problem.  Allena Katz, MD Allergy / Immunology [place]

## 2016-11-26 NOTE — Patient Instructions (Addendum)
  1. Continue Advair 230 two puffs two times per day.  2. Continue Flonase 1-2 sprays each nostril one time per day   3. Continue Omeprazole 40 one tablet two times per day  4. Continue Montelukast 10mg  one tablet one times per day  5. Continue Spiriva Respimat 2.5 two inhalation one time per day  5. If needed:   A. Proair HFA 2 puffs every 4-6 hours  B. Epi-pen  C. Pataday one drop each eye 1-2 times per day  D. OTC antihistamine one tablet one time per day  E. Azelastine 1-2 sprays each nostril 1-2 times per day  6. Continue to Retry nicotine substitutes to replace tobacco smoke exposure  7. Continue to try to Decrease caffeine use   8. Follow up with cardiologist for diasystolic dysfunction  9. Obtain fall flu vaccine  10. Return in 6 months or earlier if problem

## 2016-12-18 DIAGNOSIS — K219 Gastro-esophageal reflux disease without esophagitis: Secondary | ICD-10-CM | POA: Diagnosis not present

## 2016-12-18 DIAGNOSIS — Z23 Encounter for immunization: Secondary | ICD-10-CM | POA: Diagnosis not present

## 2016-12-18 DIAGNOSIS — E785 Hyperlipidemia, unspecified: Secondary | ICD-10-CM | POA: Diagnosis not present

## 2016-12-18 DIAGNOSIS — J449 Chronic obstructive pulmonary disease, unspecified: Secondary | ICD-10-CM | POA: Diagnosis not present

## 2016-12-18 DIAGNOSIS — F17210 Nicotine dependence, cigarettes, uncomplicated: Secondary | ICD-10-CM | POA: Diagnosis not present

## 2016-12-18 DIAGNOSIS — F17200 Nicotine dependence, unspecified, uncomplicated: Secondary | ICD-10-CM | POA: Diagnosis not present

## 2016-12-18 DIAGNOSIS — E1169 Type 2 diabetes mellitus with other specified complication: Secondary | ICD-10-CM | POA: Diagnosis not present

## 2016-12-27 DIAGNOSIS — Z9189 Other specified personal risk factors, not elsewhere classified: Secondary | ICD-10-CM | POA: Diagnosis not present

## 2016-12-27 DIAGNOSIS — R600 Localized edema: Secondary | ICD-10-CM | POA: Diagnosis not present

## 2016-12-27 DIAGNOSIS — R0600 Dyspnea, unspecified: Secondary | ICD-10-CM | POA: Diagnosis not present

## 2016-12-27 DIAGNOSIS — R000 Tachycardia, unspecified: Secondary | ICD-10-CM | POA: Diagnosis not present

## 2016-12-27 DIAGNOSIS — Z6835 Body mass index (BMI) 35.0-35.9, adult: Secondary | ICD-10-CM | POA: Diagnosis not present

## 2016-12-27 DIAGNOSIS — I519 Heart disease, unspecified: Secondary | ICD-10-CM | POA: Diagnosis not present

## 2016-12-27 DIAGNOSIS — E6609 Other obesity due to excess calories: Secondary | ICD-10-CM | POA: Diagnosis not present

## 2016-12-27 DIAGNOSIS — F17210 Nicotine dependence, cigarettes, uncomplicated: Secondary | ICD-10-CM | POA: Diagnosis not present

## 2017-01-14 DIAGNOSIS — F209 Schizophrenia, unspecified: Secondary | ICD-10-CM | POA: Diagnosis not present

## 2017-01-21 DIAGNOSIS — Z79899 Other long term (current) drug therapy: Secondary | ICD-10-CM | POA: Diagnosis not present

## 2017-02-04 DIAGNOSIS — H1013 Acute atopic conjunctivitis, bilateral: Secondary | ICD-10-CM | POA: Diagnosis not present

## 2017-02-07 DIAGNOSIS — N3000 Acute cystitis without hematuria: Secondary | ICD-10-CM | POA: Diagnosis not present

## 2017-02-07 DIAGNOSIS — N3001 Acute cystitis with hematuria: Secondary | ICD-10-CM | POA: Diagnosis not present

## 2017-02-15 DIAGNOSIS — H1013 Acute atopic conjunctivitis, bilateral: Secondary | ICD-10-CM | POA: Diagnosis not present

## 2017-03-01 DIAGNOSIS — Z6833 Body mass index (BMI) 33.0-33.9, adult: Secondary | ICD-10-CM | POA: Diagnosis not present

## 2017-03-01 DIAGNOSIS — J988 Other specified respiratory disorders: Secondary | ICD-10-CM | POA: Diagnosis not present

## 2017-03-01 DIAGNOSIS — H1010 Acute atopic conjunctivitis, unspecified eye: Secondary | ICD-10-CM | POA: Diagnosis not present

## 2017-03-12 ENCOUNTER — Other Ambulatory Visit: Payer: Self-pay

## 2017-03-12 MED ORDER — MONTELUKAST SODIUM 10 MG PO TABS: 10.0000 mg | ORAL_TABLET | Freq: Every day | ORAL | 0 refills | Status: DC

## 2017-03-12 MED ORDER — AZELASTINE HCL 137 MCG/SPRAY NA SOLN: 1.0000 | Freq: Two times a day (BID) | NASAL | 0 refills | Status: DC | PRN

## 2017-03-12 MED ORDER — ALBUTEROL SULFATE HFA 108 (90 BASE) MCG/ACT IN AERS: INHALATION_SPRAY | RESPIRATORY_TRACT | 0 refills | Status: DC

## 2017-03-12 MED ORDER — TIOTROPIUM BROMIDE MONOHYDRATE 2.5 MCG/ACT IN AERS: INHALATION_SPRAY | RESPIRATORY_TRACT | 0 refills | Status: DC

## 2017-03-12 MED ORDER — ALBUTEROL SULFATE (2.5 MG/3ML) 0.083% IN NEBU: INHALATION_SOLUTION | RESPIRATORY_TRACT | 0 refills | Status: DC

## 2017-03-12 MED ORDER — CETIRIZINE HCL 10 MG PO TABS: ORAL_TABLET | ORAL | 0 refills | Status: DC

## 2017-03-12 MED ORDER — FLUTICASONE PROPIONATE 50 MCG/ACT NA SUSP: NASAL | 0 refills | Status: DC

## 2017-03-12 MED ORDER — OMEPRAZOLE 40 MG PO CPDR: 40.0000 mg | DELAYED_RELEASE_CAPSULE | Freq: Two times a day (BID) | ORAL | 0 refills | Status: DC

## 2017-03-12 MED ORDER — OLOPATADINE HCL 0.1 % OP SOLN: OPHTHALMIC | 0 refills | Status: DC

## 2017-04-01 DIAGNOSIS — Z6833 Body mass index (BMI) 33.0-33.9, adult: Secondary | ICD-10-CM | POA: Diagnosis not present

## 2017-04-01 DIAGNOSIS — J209 Acute bronchitis, unspecified: Secondary | ICD-10-CM | POA: Diagnosis not present

## 2017-04-27 DIAGNOSIS — A088 Other specified intestinal infections: Secondary | ICD-10-CM | POA: Diagnosis not present

## 2017-05-07 DIAGNOSIS — F209 Schizophrenia, unspecified: Secondary | ICD-10-CM | POA: Diagnosis not present

## 2017-05-14 ENCOUNTER — Other Ambulatory Visit: Payer: Self-pay | Admitting: *Deleted

## 2017-05-14 MED ORDER — FLUTICASONE PROPIONATE 50 MCG/ACT NA SUSP: NASAL | 0 refills | Status: DC

## 2017-05-14 NOTE — Telephone Encounter (Signed)
Patient called and request refill Fluticasone to [facility]. Rx sent

## 2017-05-27 ENCOUNTER — Ambulatory Visit (INDEPENDENT_AMBULATORY_CARE_PROVIDER_SITE_OTHER): Payer: Medicare Other | Admitting: Allergy and Immunology

## 2017-05-27 ENCOUNTER — Encounter: Payer: Self-pay | Admitting: Allergy and Immunology

## 2017-05-27 VITALS — BP 120/78 | HR 88 | Resp 14

## 2017-05-27 DIAGNOSIS — K219 Gastro-esophageal reflux disease without esophagitis: Secondary | ICD-10-CM | POA: Diagnosis not present

## 2017-05-27 DIAGNOSIS — J449 Chronic obstructive pulmonary disease, unspecified: Secondary | ICD-10-CM

## 2017-05-27 DIAGNOSIS — J3089 Other allergic rhinitis: Secondary | ICD-10-CM | POA: Diagnosis not present

## 2017-05-27 DIAGNOSIS — Z720 Tobacco use: Secondary | ICD-10-CM | POA: Diagnosis not present

## 2017-05-27 MED ORDER — OLOPATADINE HCL 0.2 % OP SOLN: OPHTHALMIC | 1 refills | Status: DC

## 2017-05-27 NOTE — Progress Notes (Signed)
Follow-up Note  Referring Provider: Street, Sharon Mt, * Primary Provider: Street, Sharon Mt, MD Date of Office Visit: 05/27/2017  Subjective:   Carol Vazquez (DOB: 12-Aug-1975) is a 42 y.o. female who returns to the [facility] [place] on 05/27/2017 in re-evaluation of the following:  HPI: [name] returns to this clinic in reevaluation of asthma, allergic rhinitis, reflux, and tobacco use.  She was last seen in this clinic 26 November 2016.  It does not sound as though she has required the administration of a systemic steroid or antibiotic to treat any type of respiratory tract issue.  In fact, it actually sounds as though she has done relatively well with issues of shortness of breath and coughing and wheezing.    However, about 1 week ago she started to develop problems with feeling as though is very hard to get air into her chest and she has been using her bronchodilator a few times a day which may help somewhat.  She does not have any obvious provoking factor giving rise to this issue.  However, it should be noted that during this timeframe she did double up on her tramadol and her tizanidine because her legs are bothering her.  She denies any chest pain or ugly sputum production or fever or associated upper airway symptoms.  Her reflux is under very good control at this point in time.  She still drinks a 12 pack of caffeinated drinks per day and she still smokes 1-2 packs of cigarettes per day.  He still continues to carry an EpiPen because whenever she is bit by any type of insect whether it be a spider or a mosquito or an aut or a bee or similar she develops a large local reaction and sometimes feels as though her throat gets tight.  Allergies as of 05/27/2017      Reactions   Ivp Dye [iodinated Diagnostic Agents] Shortness Of Breath   Mobic [meloxicam] Shortness Of Breath, Swelling   Aleve [naproxen Sodium]    Other reaction(s): GI Upset  (intolerance) unknown unknown   Penicillins Diarrhea   Seroquel [quetiapine Fumarate]    Altered Mental Status      Medication List      albuterol 108 (90 Base) MCG/ACT inhaler Commonly known as:  PROAIR HFA USE 2 INHALATIONS EVERY 4 TO 6 HOURS AS NEEDED FOR COUGH OR WHEEZE   albuterol (2.5 MG/3ML) 0.083% nebulizer solution Commonly known as:  PROVENTIL Inhale the contents of one vial in nebulizer every four to six hours as needed for cough or wheeze.   Azelastine HCl 137 MCG/SPRAY Soln Place 1 spray into the nose 2 (two) times daily as needed.   CALCIUM PO Take by mouth daily.   cetirizine 10 MG tablet Commonly known as:  ZYRTEC Can take one tablet once daily if needed.   COLLAGEN PO Take by mouth.   EPINEPHrine 0.3 mg/0.3 mL Soaj injection Commonly known as:  EPIPEN 2-PAK Use as directed for life-threatening allergic reaction.   FLUoxetine 20 MG capsule Commonly known as:  PROZAC Take 20 mg by mouth daily.   fluticasone 50 MCG/ACT nasal spray Commonly known as:  FLONASE Use one to two sprays in each nostril once daily.   fluticasone-salmeterol 230-21 MCG/ACT inhaler Commonly known as:  ADVAIR HFA USE 2 INHALATIONS TWICE DAILY   GEODON 20 MG capsule Generic drug:  ziprasidone Take 20 mg by mouth 2 (two) times daily with a meal.   montelukast 10 MG tablet  Commonly known as:  SINGULAIR Take 1 tablet (10 mg total) by mouth daily.   multivitamin tablet Take 1 tablet by mouth daily.   olopatadine 0.1 % ophthalmic solution Commonly known as:  PATANOL Can use one drop in each eye twice daily as directed.   omeprazole 40 MG capsule Commonly known as:  PRILOSEC Take 1 capsule (40 mg total) by mouth 2 (two) times daily.   PROLIXIN PO Take 5 mg by mouth daily.   Tiotropium Bromide Monohydrate 2.5 MCG/ACT Aers Commonly known as:  SPIRIVA RESPIMAT Inhale two puffs once daily as directed   tiZANidine 2 MG tablet Commonly known as:  ZANAFLEX take 1 tablet  by mouth three times a day if needed for MUSCLE SPASM   TRAMADOL HCL PO Take by mouth.   VITAMIN B-12 PO Take by mouth.       Past Medical History:  Diagnosis Date  . Allergic rhinitis   . Asthma   . GERD (gastroesophageal reflux disease)   . Tobacco abuse     History reviewed. No pertinent surgical history.  Review of systems negative except as noted in HPI / PMHx or noted below:  Review of Systems  Constitutional: Negative.   HENT: Negative.   Eyes: Negative.   Respiratory: Negative.   Cardiovascular: Negative.   Gastrointestinal: Negative.   Genitourinary: Negative.   Musculoskeletal: Negative.   Skin: Negative.   Neurological: Negative.   Endo/Heme/Allergies: Negative.   Psychiatric/Behavioral: Negative.      Objective:   Vitals:   05/27/17 1554  BP: 120/78  Pulse: 88  Resp: 14  SpO2: 98%          Physical Exam  Constitutional: She is well-developed, well-nourished, and in no distress.  HENT:  Head: Normocephalic.  Right Ear: Tympanic membrane, external ear and ear canal normal.  Left Ear: Tympanic membrane, external ear and ear canal normal.  Nose: Nose normal. No mucosal edema or rhinorrhea.  Mouth/Throat: Uvula is midline, oropharynx is clear and moist and mucous membranes are normal. No oropharyngeal exudate.  Eyes: Conjunctivae are normal.  Neck: Trachea normal. No tracheal tenderness present. No tracheal deviation present. No thyromegaly present.  Cardiovascular: Normal rate, regular rhythm, S1 normal, S2 normal and normal heart sounds.  No murmur heard. Pulmonary/Chest: Breath sounds normal. No stridor. No respiratory distress. She has no wheezes. She has no rales.  Musculoskeletal: She exhibits no edema.  Lymphadenopathy:       Head (right side): No tonsillar adenopathy present.       Head (left side): No tonsillar adenopathy present.    She has no cervical adenopathy.  Neurological: She is alert. Gait normal.  Skin: No rash noted. She  is not diaphoretic. No erythema. Nails show no clubbing.  Psychiatric: Mood and affect normal.    Diagnostics:     Spirometry was performed and demonstrated an FEV1 of 2.80 at 76 % of predicted.  The patient had an Asthma Control Test with the following results: ACT Total Score: 14.    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 Tobacco abuse   3. Other allergic rhinitis   4. Gastroesophageal reflux disease, esophagitis presence not specified     1. Continue Advair 230 two puffs two times per day.  2. Continue Flonase 1-2 sprays each nostril one time per day   3. Continue Omeprazole 40 one tablet two times per day  4. Continue Montelukast 10mg  one tablet one times per day  5.  Continue Spiriva Respimat 2.5 two inhalation one time per day  5. If needed:   A. Proair HFA 2 puffs every 4-6 hours  B. Epi-pen  C. Pataday one drop each eye 1-2 times per day  D. OTC antihistamine one tablet one time per day  E. Azelastine 1-2 sprays each nostril 1-2 times per day  6. Continue to Retry nicotine substitutes to replace tobacco smoke exposure  7. Continue to try to Decrease caffeine use   8. Decrease tramadol and tizanidine.  Further evaluation and treatment?  9. Return in 6 months or earlier if problem  I will assume that Quinetta has developed a side effect from her high-dose tramadol and tizanidine and I have asked her to modify this dosage by at least 50%.  If she has problems with her legs she can follow-up with her primary care physician or other specialist to address this issue.  I once again also had a talk with her today about the need to cut back on caffeine and tobacco use as there is no doubt that these are contributing to some of her problems.  She will continue on anti-inflammatory agents for her respiratory tract and treatment directed against reflux and I will see her back in this clinic in 6 months or earlier if there is a problem.  Allena Katz, MD Allergy /  Immunology [place]

## 2017-05-27 NOTE — Patient Instructions (Addendum)
  1. Continue Advair 230 two puffs two times per day.  2. Continue Flonase 1-2 sprays each nostril one time per day   3. Continue Omeprazole 40 one tablet two times per day  4. Continue Montelukast 10mg  one tablet one times per day  5. Continue Spiriva Respimat 2.5 two inhalation one time per day  5. If needed:   A. Proair HFA 2 puffs every 4-6 hours  B. Epi-pen  C. Pataday one drop each eye 1-2 times per day  D. OTC antihistamine one tablet one time per day  E. Azelastine 1-2 sprays each nostril 1-2 times per day  6. Continue to Retry nicotine substitutes to replace tobacco smoke exposure  7. Continue to try to Decrease caffeine use   8. Decrease tramadol and tizanidine.  Further evaluation and treatment?  9. Return in 6 months or earlier if problem

## 2017-05-28 ENCOUNTER — Encounter: Payer: Self-pay | Admitting: Allergy and Immunology

## 2017-07-22 DIAGNOSIS — E1169 Type 2 diabetes mellitus with other specified complication: Secondary | ICD-10-CM | POA: Diagnosis not present

## 2017-07-22 DIAGNOSIS — K219 Gastro-esophageal reflux disease without esophagitis: Secondary | ICD-10-CM | POA: Diagnosis not present

## 2017-07-22 DIAGNOSIS — Q600 Renal agenesis, unilateral: Secondary | ICD-10-CM | POA: Diagnosis not present

## 2017-07-22 DIAGNOSIS — E229 Hyperfunction of pituitary gland, unspecified: Secondary | ICD-10-CM | POA: Diagnosis not present

## 2017-07-22 DIAGNOSIS — E785 Hyperlipidemia, unspecified: Secondary | ICD-10-CM | POA: Diagnosis not present

## 2017-07-22 DIAGNOSIS — F17210 Nicotine dependence, cigarettes, uncomplicated: Secondary | ICD-10-CM | POA: Diagnosis not present

## 2017-07-22 DIAGNOSIS — F17200 Nicotine dependence, unspecified, uncomplicated: Secondary | ICD-10-CM | POA: Diagnosis not present

## 2017-07-22 DIAGNOSIS — Z79899 Other long term (current) drug therapy: Secondary | ICD-10-CM | POA: Diagnosis not present

## 2017-07-29 DIAGNOSIS — Z6833 Body mass index (BMI) 33.0-33.9, adult: Secondary | ICD-10-CM | POA: Diagnosis not present

## 2017-07-29 DIAGNOSIS — F17200 Nicotine dependence, unspecified, uncomplicated: Secondary | ICD-10-CM | POA: Diagnosis not present

## 2017-07-29 DIAGNOSIS — Z0000 Encounter for general adult medical examination without abnormal findings: Secondary | ICD-10-CM | POA: Diagnosis not present

## 2017-07-29 DIAGNOSIS — E785 Hyperlipidemia, unspecified: Secondary | ICD-10-CM | POA: Diagnosis not present

## 2017-07-29 DIAGNOSIS — E669 Obesity, unspecified: Secondary | ICD-10-CM | POA: Diagnosis not present

## 2017-07-29 DIAGNOSIS — F17210 Nicotine dependence, cigarettes, uncomplicated: Secondary | ICD-10-CM | POA: Diagnosis not present

## 2017-07-29 DIAGNOSIS — K219 Gastro-esophageal reflux disease without esophagitis: Secondary | ICD-10-CM | POA: Diagnosis not present

## 2017-07-29 DIAGNOSIS — Z124 Encounter for screening for malignant neoplasm of cervix: Secondary | ICD-10-CM | POA: Diagnosis not present

## 2017-07-29 DIAGNOSIS — E1169 Type 2 diabetes mellitus with other specified complication: Secondary | ICD-10-CM | POA: Diagnosis not present

## 2017-08-06 DIAGNOSIS — F209 Schizophrenia, unspecified: Secondary | ICD-10-CM | POA: Diagnosis not present

## 2017-08-19 DIAGNOSIS — N3000 Acute cystitis without hematuria: Secondary | ICD-10-CM | POA: Diagnosis not present

## 2017-08-19 DIAGNOSIS — Z1231 Encounter for screening mammogram for malignant neoplasm of breast: Secondary | ICD-10-CM | POA: Diagnosis not present

## 2017-08-19 DIAGNOSIS — N3090 Cystitis, unspecified without hematuria: Secondary | ICD-10-CM | POA: Diagnosis not present

## 2017-08-19 DIAGNOSIS — M62838 Other muscle spasm: Secondary | ICD-10-CM | POA: Diagnosis not present

## 2017-10-02 DIAGNOSIS — J209 Acute bronchitis, unspecified: Secondary | ICD-10-CM | POA: Diagnosis not present

## 2017-10-02 DIAGNOSIS — N898 Other specified noninflammatory disorders of vagina: Secondary | ICD-10-CM | POA: Diagnosis not present

## 2017-10-02 DIAGNOSIS — L259 Unspecified contact dermatitis, unspecified cause: Secondary | ICD-10-CM | POA: Diagnosis not present

## 2017-11-05 DIAGNOSIS — F209 Schizophrenia, unspecified: Secondary | ICD-10-CM | POA: Diagnosis not present

## 2017-12-17 ENCOUNTER — Telehealth: Payer: Self-pay | Admitting: Allergy and Immunology

## 2017-12-17 NOTE — Telephone Encounter (Signed)
Carol Vazquez called in and stated she needs all of her medications sent in to [facility].  Carol Vazquez also states she needs the generic of Pataday sent to [facility] as well.

## 2017-12-17 NOTE — Telephone Encounter (Signed)
Patient has appt next week can do then.  Was due in August

## 2017-12-17 NOTE — Telephone Encounter (Signed)
Unable to leave message not available per message

## 2017-12-18 NOTE — Telephone Encounter (Signed)
Tried to contact patient yesterday again and today and advised wireless customer unavailable try later. Patient has appt 9/12 and we can refill then

## 2017-12-26 ENCOUNTER — Ambulatory Visit (INDEPENDENT_AMBULATORY_CARE_PROVIDER_SITE_OTHER): Payer: Medicare Other | Admitting: Allergy and Immunology

## 2017-12-26 ENCOUNTER — Encounter: Payer: Self-pay | Admitting: Allergy and Immunology

## 2017-12-26 VITALS — BP 126/76 | HR 98 | Resp 16 | Ht 60.0 in | Wt 177.4 lb

## 2017-12-26 DIAGNOSIS — J449 Chronic obstructive pulmonary disease, unspecified: Secondary | ICD-10-CM | POA: Diagnosis not present

## 2017-12-26 DIAGNOSIS — K219 Gastro-esophageal reflux disease without esophagitis: Secondary | ICD-10-CM

## 2017-12-26 DIAGNOSIS — Z720 Tobacco use: Secondary | ICD-10-CM

## 2017-12-26 DIAGNOSIS — J3089 Other allergic rhinitis: Secondary | ICD-10-CM

## 2017-12-26 MED ORDER — EPINEPHRINE 0.3 MG/0.3ML IJ SOAJ: INTRAMUSCULAR | 1 refills | Status: DC

## 2017-12-26 MED ORDER — ALBUTEROL SULFATE HFA 108 (90 BASE) MCG/ACT IN AERS: INHALATION_SPRAY | RESPIRATORY_TRACT | 0 refills | Status: DC

## 2017-12-26 MED ORDER — OMEPRAZOLE 40 MG PO CPDR: 40.0000 mg | DELAYED_RELEASE_CAPSULE | Freq: Two times a day (BID) | ORAL | 1 refills | Status: DC

## 2017-12-26 MED ORDER — MONTELUKAST SODIUM 10 MG PO TABS: 10.0000 mg | ORAL_TABLET | Freq: Every day | ORAL | 1 refills | Status: DC

## 2017-12-26 MED ORDER — TIOTROPIUM BROMIDE MONOHYDRATE 2.5 MCG/ACT IN AERS: INHALATION_SPRAY | RESPIRATORY_TRACT | 1 refills | Status: DC

## 2017-12-26 MED ORDER — FLUTICASONE PROPIONATE 50 MCG/ACT NA SUSP: NASAL | 1 refills | Status: DC

## 2017-12-26 MED ORDER — FLUTICASONE-SALMETEROL 230-21 MCG/ACT IN AERO: INHALATION_SPRAY | RESPIRATORY_TRACT | 1 refills | Status: DC

## 2017-12-26 MED ORDER — AZELASTINE HCL 137 MCG/SPRAY NA SOLN: 1.0000 | Freq: Two times a day (BID) | NASAL | 1 refills | Status: DC | PRN

## 2017-12-26 MED ORDER — OLOPATADINE HCL 0.2 % OP SOLN: OPHTHALMIC | 1 refills | Status: DC

## 2017-12-26 MED ORDER — CETIRIZINE HCL 10 MG PO TABS: ORAL_TABLET | ORAL | 1 refills | Status: DC

## 2017-12-26 NOTE — Progress Notes (Signed)
Follow-up Note  Referring Provider: Street, Sharon Mt, * Primary Provider: Street, Sharon Mt, MD Date of Office Visit: 12/26/2017  Subjective:   Carol Vazquez (DOB: [birth year]) is a 42 y.o. female who returns to the Allergy and [employer] on 12/26/2017 in re-evaluation of the following:  HPI: Carol Vazquez presents to this clinic in evaluation of asthma, allergic rhinitis, reflux, and tobacco use.  Her last visit to this clinic was 27 May 2017.  Overall she has really done very well with her asthma.  She rarely uses a short acting bronchodilator.  She has not been to the urgent care center or to her doctor to receive a systemic steroid or antibiotic for any type of respiratory tract issue.  She does consistently use Spiriva and montelukast and Advair.  She still continues to smoke around 2 packs of cigarettes per day.  Her nose is doing quite well at this point in time but she recently ran out of her Flonase and she cannot get that prescription filled for another week or so.  She is asking for a sample of a nasal steroid until that point in time.  She has not been having any problems with reflux.  She continues to use omeprazole.  She has decreased her caffeinated drink use from 12 drinks a day to 7 drinks a day.  Allergies as of 12/26/2017      Reactions   Ivp Dye [iodinated Diagnostic Agents] Shortness Of Breath   Mobic [meloxicam] Shortness Of Breath, Swelling   Aleve [naproxen Sodium]    Other reaction(s): GI Upset (intolerance) unknown unknown   Penicillins Diarrhea   Seroquel [quetiapine Fumarate]    Altered Mental Status      Medication List      albuterol 108 (90 Base) MCG/ACT inhaler Commonly known as:  PROVENTIL HFA;VENTOLIN HFA USE 2 INHALATIONS EVERY 4 TO 6 HOURS AS NEEDED FOR COUGH OR WHEEZE   albuterol (2.5 MG/3ML) 0.083% nebulizer solution Commonly known as:  PROVENTIL Inhale the contents of one vial in nebulizer every four to six hours as  needed for cough or wheeze.   atorvastatin 40 MG tablet Commonly known as:  LIPITOR Take by mouth.   Azelastine HCl 137 MCG/SPRAY Soln Place 1 spray into the nose 2 (two) times daily as needed.   CALCIUM PO Take by mouth daily.   cetirizine 10 MG tablet Commonly known as:  ZYRTEC Can take one tablet once daily if needed.   COLLAGEN PO Take by mouth.   EPINEPHrine 0.3 mg/0.3 mL Soaj injection Commonly known as:  EPI-PEN Use as directed for life-threatening allergic reaction.   FLUoxetine 20 MG capsule Commonly known as:  PROZAC Take 20 mg by mouth daily.   fluticasone 50 MCG/ACT nasal spray Commonly known as:  FLONASE Use one to two sprays in each nostril once daily.   fluticasone-salmeterol 230-21 MCG/ACT inhaler Commonly known as:  ADVAIR HFA USE 2 INHALATIONS TWICE DAILY   GEODON 20 MG capsule Generic drug:  ziprasidone Take 20 mg by mouth 2 (two) times daily with a meal.   montelukast 10 MG tablet Commonly known as:  SINGULAIR Take 1 tablet (10 mg total) by mouth daily.   multivitamin tablet Take 1 tablet by mouth daily.   Olopatadine HCl 0.2 % Soln Can use one drop in each eye one to two times daily if needed.   omeprazole 40 MG capsule Commonly known as:  PRILOSEC Take 1 capsule (40 mg total) by mouth 2 (two)  times daily.   PROLIXIN PO Take 5 mg by mouth daily.   SOMA 250 MG tablet Generic drug:  carisoprodol Take by mouth.   Tiotropium Bromide Monohydrate 2.5 MCG/ACT Aers Inhale two puffs once daily as directed   tiZANidine 4 MG tablet Commonly known as:  ZANAFLEX TK 1 T PO  TID PRN FOR MUSCLE SPASM   TRAMADOL HCL PO Take by mouth.   VITAMIN B-12 PO Take by mouth.       Past Medical History:  Diagnosis Date  . Allergic rhinitis   . Asthma   . GERD (gastroesophageal reflux disease)   . Tobacco abuse     History reviewed. No pertinent surgical history.  Review of systems negative except as noted in HPI / PMHx or noted  below:  Review of Systems  Constitutional: Negative.   HENT: Negative.   Eyes: Negative.   Respiratory: Negative.   Cardiovascular: Negative.   Gastrointestinal: Negative.   Genitourinary: Negative.   Musculoskeletal: Negative.   Skin: Negative.   Neurological: Negative.   Endo/Heme/Allergies: Negative.   Psychiatric/Behavioral: Negative.      Objective:   Vitals:   12/26/17 1052  BP: 126/76  Pulse: 98  Resp: 16  SpO2: 95%   Height: 5' (152.4 cm)  Weight: 177 lb 6.4 oz (80.5 kg)   Physical Exam  HENT:  Head: Normocephalic. Head is without right periorbital erythema and without left periorbital erythema.  Right Ear: Tympanic membrane, external ear and ear canal normal.  Left Ear: Tympanic membrane, external ear and ear canal normal.  Nose: Nose normal. No mucosal edema or rhinorrhea.  Mouth/Throat: Oropharynx is clear and moist and mucous membranes are normal. No oropharyngeal exudate.  Eyes: Pupils are equal, round, and reactive to light. Conjunctivae and lids are normal.  Neck: Trachea normal. No tracheal deviation present. No thyromegaly present.  Cardiovascular: Normal rate, regular rhythm, S1 normal, S2 normal and normal heart sounds.  No murmur heard. Pulmonary/Chest: Effort normal. No stridor. No respiratory distress. She has no wheezes. She has no rales. She exhibits no tenderness.  Abdominal: Soft. She exhibits no distension and no mass. There is no hepatosplenomegaly. There is no tenderness. There is no rebound and no guarding.  Musculoskeletal: She exhibits no edema or tenderness.  Lymphadenopathy:       Head (right side): No tonsillar adenopathy present.       Head (left side): No tonsillar adenopathy present.    She has no cervical adenopathy.    She has no axillary adenopathy.  Neurological: She is alert.  Skin: No rash noted. She is not diaphoretic. No erythema. No pallor. Nails show no clubbing.    Diagnostics:    Spirometry was performed and  demonstrated an FEV1 of 1.55 at 61 % of predicted.  The patient had an Asthma Control Test with the following results: ACT Total Score: 18.    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 Tobacco abuse   3. Other allergic rhinitis   4. Gastroesophageal reflux disease, esophagitis presence not specified     1. Continue Advair 230 two puffs two times per day.  2. Continue Flonase 1-2 sprays each nostril one time per day (Nasacort sample)  3. Continue Omeprazole 40 one tablet two times per day  4. Continue Montelukast 10mg  one tablet one times per day  5. Continue Spiriva Respimat 2.5 two inhalation one time per day  5. If needed:   A. Proair HFA 2 puffs every  4-6 hours  B. Epi-pen  C. Pataday one drop each eye 1-2 times per day  D. OTC antihistamine one tablet one time per day  E. Azelastine 1-2 sprays each nostril 1-2 times per day  6. Continue to Retry nicotine substitutes to replace tobacco smoke exposure  7. Continue to try to Decrease caffeine use   8. Obtain fall flu vaccine  9. Return in 6 months or earlier if problem  Yeni appears to be doing very well at this point in time.  Of course this improvement comes in the face of utilizing a very large collection of medical therapy directed against respiratory tract inflammation.  I once again had a talk with her today about the need to discontinue her tobacco use.  Her reflux also appears to be doing relatively well on her current plan.  I will now see her back in this clinic in 6 months or earlier if there is a problem.  Allena Katz, MD Allergy / Immunology [place]

## 2017-12-26 NOTE — Patient Instructions (Addendum)
  1. Continue Advair 230 two puffs two times per day.  2. Continue Flonase 1-2 sprays each nostril one time per day (Nasacort sample)  3. Continue Omeprazole 40 one tablet two times per day  4. Continue Montelukast 10mg  one tablet one times per day  5. Continue Spiriva Respimat 2.5 two inhalation one time per day  6. If needed:   A. Proair HFA 2 puffs every 4-6 hours  B. Epi-pen  C. Pataday one drop each eye 1-2 times per day  D. OTC antihistamine one tablet one time per day  E. Azelastine 1-2 sprays each nostril 1-2 times per day  7. Continue to Retry nicotine substitutes to replace tobacco smoke exposure  8. Continue to try to Decrease caffeine use   9. Obtain fall flu vaccine  10. Return in 6 months or earlier if problem

## 2017-12-30 ENCOUNTER — Encounter: Payer: Self-pay | Admitting: Allergy and Immunology

## 2018-01-15 DIAGNOSIS — F17210 Nicotine dependence, cigarettes, uncomplicated: Secondary | ICD-10-CM | POA: Diagnosis not present

## 2018-01-15 DIAGNOSIS — Z79899 Other long term (current) drug therapy: Secondary | ICD-10-CM | POA: Diagnosis not present

## 2018-01-15 DIAGNOSIS — Z23 Encounter for immunization: Secondary | ICD-10-CM | POA: Diagnosis not present

## 2018-01-15 DIAGNOSIS — E1169 Type 2 diabetes mellitus with other specified complication: Secondary | ICD-10-CM | POA: Diagnosis not present

## 2018-01-15 DIAGNOSIS — Q600 Renal agenesis, unilateral: Secondary | ICD-10-CM | POA: Diagnosis not present

## 2018-01-15 DIAGNOSIS — F17200 Nicotine dependence, unspecified, uncomplicated: Secondary | ICD-10-CM | POA: Diagnosis not present

## 2018-01-15 DIAGNOSIS — R300 Dysuria: Secondary | ICD-10-CM | POA: Diagnosis not present

## 2018-01-15 DIAGNOSIS — E785 Hyperlipidemia, unspecified: Secondary | ICD-10-CM | POA: Diagnosis not present

## 2018-01-15 DIAGNOSIS — J449 Chronic obstructive pulmonary disease, unspecified: Secondary | ICD-10-CM | POA: Diagnosis not present

## 2018-01-15 DIAGNOSIS — K219 Gastro-esophageal reflux disease without esophagitis: Secondary | ICD-10-CM | POA: Diagnosis not present

## 2018-02-18 DIAGNOSIS — F209 Schizophrenia, unspecified: Secondary | ICD-10-CM | POA: Diagnosis not present

## 2018-02-24 ENCOUNTER — Other Ambulatory Visit: Payer: Self-pay | Admitting: *Deleted

## 2018-02-24 MED ORDER — MONTELUKAST SODIUM 10 MG PO TABS: ORAL_TABLET | ORAL | 1 refills | Status: DC

## 2018-04-21 ENCOUNTER — Telehealth: Payer: Self-pay | Admitting: *Deleted

## 2018-04-21 NOTE — Telephone Encounter (Signed)
Carol Vazquez called and requested samples of Nasacort because the Fluticasone is not working as well. Dr. Neldon Mc has mentioned in his last note that she can try Nasacort. I will leave samples up front.

## 2018-05-01 ENCOUNTER — Encounter: Payer: Self-pay | Admitting: Allergy and Immunology

## 2018-05-01 ENCOUNTER — Ambulatory Visit (INDEPENDENT_AMBULATORY_CARE_PROVIDER_SITE_OTHER): Payer: Medicare Other | Admitting: Allergy and Immunology

## 2018-05-01 VITALS — BP 120/64 | HR 92 | Resp 14

## 2018-05-01 DIAGNOSIS — Z720 Tobacco use: Secondary | ICD-10-CM | POA: Diagnosis not present

## 2018-05-01 DIAGNOSIS — J3089 Other allergic rhinitis: Secondary | ICD-10-CM

## 2018-05-01 DIAGNOSIS — J449 Chronic obstructive pulmonary disease, unspecified: Secondary | ICD-10-CM

## 2018-05-01 DIAGNOSIS — K219 Gastro-esophageal reflux disease without esophagitis: Secondary | ICD-10-CM | POA: Diagnosis not present

## 2018-05-01 MED ORDER — AZELASTINE HCL 137 MCG/SPRAY NA SOLN: 1.0000 | Freq: Two times a day (BID) | NASAL | 1 refills | Status: DC | PRN

## 2018-05-01 MED ORDER — MONTELUKAST SODIUM 10 MG PO TABS: ORAL_TABLET | ORAL | 1 refills | Status: DC

## 2018-05-01 MED ORDER — CETIRIZINE HCL 10 MG PO TABS: ORAL_TABLET | ORAL | 1 refills | Status: DC

## 2018-05-01 MED ORDER — TIOTROPIUM BROMIDE MONOHYDRATE 2.5 MCG/ACT IN AERS: INHALATION_SPRAY | RESPIRATORY_TRACT | 1 refills | Status: DC

## 2018-05-01 MED ORDER — FLUTICASONE-SALMETEROL 115-21 MCG/ACT IN AERO: INHALATION_SPRAY | RESPIRATORY_TRACT | 1 refills | Status: DC

## 2018-05-01 MED ORDER — FLUTICASONE PROPIONATE 50 MCG/ACT NA SUSP: NASAL | 1 refills | Status: DC

## 2018-05-01 MED ORDER — OMEPRAZOLE 40 MG PO CPDR: 40.0000 mg | DELAYED_RELEASE_CAPSULE | Freq: Two times a day (BID) | ORAL | 1 refills | Status: DC

## 2018-05-01 MED ORDER — OLOPATADINE HCL 0.2 % OP SOLN: OPHTHALMIC | 1 refills | Status: DC

## 2018-05-01 NOTE — Patient Instructions (Addendum)
  1. DECREASE Advair 115 two puffs two times per day.  2. DECREASE Flonase 1 spray each nostril one time per day    3. Continue Omeprazole 40 one tablet two times per day  4. Continue Montelukast 10mg  one tablet one times per day  5. Continue Spiriva Respimat 2.5 two inhalation one time per day  6. If needed:   A. Proair HFA 2 puffs or albuterol neb every 4-6 hours  B. Epi-pen  C. Pataday one drop each eye 1-2 times per day  D. OTC antihistamine one tablet one time per day  E. Azelastine 1-2 sprays each nostril 1-2 times per day  F. Nasal saline several times per day  7. Continue to Retry nicotine substitutes to replace tobacco smoke exposure  8. Continue to try to Decrease caffeine use   9. Return in 6 months or earlier if problem

## 2018-05-01 NOTE — Progress Notes (Signed)
Follow-up Note  Referring Provider: Street, Sharon Mt, * Primary Provider: Street, Sharon Mt, MD Date of Office Visit: 05/01/2018  Subjective:   Carol Vazquez (DOB: 28-Nov-1975) is a 43 y.o. female who returns to the Allergy and [place] on 05/01/2018 in re-evaluation of the following:  HPI: Aysa returns to this clinic in reevaluation of her asthma and allergic rhinitis and reflux and tobacco use.  Her last visit to this clinic was 26 December 2017.  Her asthma has been under very good control surprisingly.  Rarely does she use a short acting bronchodilator at this point in time.  She has not required a systemic steroid or an antibiotic since I have seen her in this clinic.  Her nose has been bothering her especially her left nostril.  She states that she gets some congestion in an area and she has to "pick off" a sore in her nose.  Otherwise, she has not had the need to use an antibiotic to treat any type of sinusitis.  She has not had any issues with reflux at this point in time.  She still continues to drink caffeinated drinks excessively.  She still smokes about 2 packs of cigarettes per day.  She did obtain the flu vaccine this year.  Allergies as of 05/01/2018      Reactions   Ivp Dye [iodinated Diagnostic Agents] Shortness Of Breath   Mobic [meloxicam] Shortness Of Breath, Swelling   Aleve [naproxen Sodium]    Other reaction(s): GI Upset (intolerance) unknown unknown   Penicillins Diarrhea   Seroquel [quetiapine Fumarate]    Altered Mental Status      Medication List      albuterol (2.5 MG/3ML) 0.083% nebulizer solution Commonly known as:  PROVENTIL Inhale the contents of one vial in nebulizer every four to six hours as needed for cough or wheeze.   albuterol 108 (90 Base) MCG/ACT inhaler Commonly known as:  PROAIR HFA USE 2 INHALATIONS EVERY 4 TO 6 HOURS AS NEEDED FOR COUGH OR WHEEZE   atorvastatin 40 MG tablet Commonly known as:   LIPITOR Take by mouth.   Azelastine HCl 137 MCG/SPRAY Soln Place 1 spray into the nose 2 (two) times daily as needed.   CALCIUM PO Take by mouth daily.   cetirizine 10 MG tablet Commonly known as:  ZYRTEC Can take one tablet once daily if needed.   COLLAGEN PO Take by mouth.   EPINEPHrine 0.3 mg/0.3 mL Soaj injection Commonly known as:  EPIPEN 2-PAK Use as directed for life-threatening allergic reaction.   FLUoxetine 20 MG capsule Commonly known as:  PROZAC Take 20 mg by mouth daily.   fluticasone 50 MCG/ACT nasal spray Commonly known as:  FLONASE Use one to two sprays in each nostril once daily.   fluticasone-salmeterol 230-21 MCG/ACT inhaler Commonly known as:  ADVAIR HFA USE 2 INHALATIONS TWICE DAILY   GEODON 20 MG capsule Generic drug:  ziprasidone Take 20 mg by mouth 2 (two) times daily with a meal.   montelukast 10 MG tablet Commonly known as:  SINGULAIR Take one tablet once daily   multivitamin tablet Take 1 tablet by mouth daily.   Olopatadine HCl 0.2 % Soln Can use one drop in each eye one to two times daily if needed.   omeprazole 40 MG capsule Commonly known as:  PRILOSEC Take 1 capsule (40 mg total) by mouth 2 (two) times daily.   PROLIXIN PO Take 5 mg by mouth daily.   SOMA 250  MG tablet Generic drug:  carisoprodol Take by mouth.   Tiotropium Bromide Monohydrate 2.5 MCG/ACT Aers Commonly known as:  SPIRIVA RESPIMAT Inhale two puffs once daily as directed   tiZANidine 4 MG tablet Commonly known as:  ZANAFLEX TK 1 T PO  TID PRN FOR MUSCLE SPASM   TRAMADOL HCL PO Take by mouth.   VITAMIN B-12 PO Take by mouth.       Past Medical History:  Diagnosis Date  . Allergic rhinitis   . Asthma   . GERD (gastroesophageal reflux disease)   . Tobacco abuse     History reviewed. No pertinent surgical history.  Review of systems negative except as noted in HPI / PMHx or noted below:  Review of Systems  Constitutional: Negative.    HENT: Negative.   Eyes: Negative.   Respiratory: Negative.   Cardiovascular: Negative.   Gastrointestinal: Negative.   Genitourinary: Negative.   Musculoskeletal: Negative.   Skin: Negative.   Neurological: Negative.   Endo/Heme/Allergies: Negative.   Psychiatric/Behavioral: Negative.      Objective:   Vitals:   05/01/18 1449  BP: 120/64  Pulse: 92  Resp: 14          Physical Exam Constitutional:      Appearance: She is not diaphoretic.  HENT:     Head: Normocephalic.     Right Ear: Tympanic membrane, ear canal and external ear normal.     Left Ear: Tympanic membrane, ear canal and external ear normal.     Nose: Nose normal. No mucosal edema or rhinorrhea.     Mouth/Throat:     Pharynx: Uvula midline. No oropharyngeal exudate.  Eyes:     Conjunctiva/sclera: Conjunctivae normal.  Neck:     Thyroid: No thyromegaly.     Trachea: Trachea normal. No tracheal tenderness or tracheal deviation.  Cardiovascular:     Rate and Rhythm: Normal rate and regular rhythm.     Heart sounds: Normal heart sounds, S1 normal and S2 normal. No murmur.  Pulmonary:     Effort: No respiratory distress.     Breath sounds: Normal breath sounds. No stridor. No wheezing or rales.  Lymphadenopathy:     Head:     Right side of head: No tonsillar adenopathy.     Left side of head: No tonsillar adenopathy.     Cervical: No cervical adenopathy.  Skin:    Findings: No erythema or rash.     Nails: There is no clubbing.   Neurological:     Mental Status: She is alert.     Diagnostics:    Spirometry was performed and demonstrated an FEV1 of 1.56 at 60 % of predicted.  The patient had an Asthma Control Test with the following results: ACT Total Score: 17.    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 Tobacco abuse   3. Other allergic rhinitis   4. Gastroesophageal reflux disease, esophagitis presence not specified     1. DECREASE Advair 115 two puffs two times per  day.  2. DECREASE Flonase 1 spray each nostril one time per day    3. Continue Omeprazole 40 one tablet two times per day  4. Continue Montelukast 10mg  one tablet one times per day  5. Continue Spiriva Respimat 2.5 two inhalation one time per day  6. If needed:   A. Proair HFA 2 puffs or albuterol neb every 4-6 hours  B. Epi-pen  C. Pataday one drop each eye 1-2  times per day  D. OTC antihistamine one tablet one time per day  E. Azelastine 1-2 sprays each nostril 1-2 times per day  F. Nasal saline several times per day  7. Continue to Retry nicotine substitutes to replace tobacco smoke exposure  8. Continue to try to Decrease caffeine use   9. Return in 6 months or earlier if problem  Lyndsie appears to be doing relatively well although she has developed some irritation in her left nostril and we will have her decrease her dose of nasal steroid and use nasal saline several times per day assuming that this will heal up this issue.  She will contact me should she have significant problems as she moves forward with her nostrils.  Otherwise, she will remain on a collection of anti-inflammatory agents for her airway and therapy directed against reflux and I will see her back in this clinic in 6 months or earlier if there is a problem.  I once again did have a talk with her today about her extensive tobacco and caffeine use.  Allena Katz, MD Allergy / Immunology [place]

## 2018-05-05 ENCOUNTER — Encounter: Payer: Self-pay | Admitting: Allergy and Immunology

## 2018-05-20 DIAGNOSIS — F209 Schizophrenia, unspecified: Secondary | ICD-10-CM | POA: Diagnosis not present

## 2018-06-02 DIAGNOSIS — J329 Chronic sinusitis, unspecified: Secondary | ICD-10-CM | POA: Diagnosis not present

## 2018-06-02 DIAGNOSIS — E7439 Other disorders of intestinal carbohydrate absorption: Secondary | ICD-10-CM | POA: Diagnosis not present

## 2018-06-02 DIAGNOSIS — J40 Bronchitis, not specified as acute or chronic: Secondary | ICD-10-CM | POA: Diagnosis not present

## 2018-06-02 DIAGNOSIS — Z6833 Body mass index (BMI) 33.0-33.9, adult: Secondary | ICD-10-CM | POA: Diagnosis not present

## 2018-06-26 ENCOUNTER — Ambulatory Visit: Payer: Medicare Other | Admitting: Allergy and Immunology

## 2018-07-21 DIAGNOSIS — E785 Hyperlipidemia, unspecified: Secondary | ICD-10-CM | POA: Diagnosis not present

## 2018-07-21 DIAGNOSIS — Q600 Renal agenesis, unilateral: Secondary | ICD-10-CM | POA: Diagnosis not present

## 2018-07-21 DIAGNOSIS — E1169 Type 2 diabetes mellitus with other specified complication: Secondary | ICD-10-CM | POA: Diagnosis not present

## 2018-07-21 DIAGNOSIS — R300 Dysuria: Secondary | ICD-10-CM | POA: Diagnosis not present

## 2018-07-21 DIAGNOSIS — K219 Gastro-esophageal reflux disease without esophagitis: Secondary | ICD-10-CM | POA: Diagnosis not present

## 2018-07-21 DIAGNOSIS — Z79899 Other long term (current) drug therapy: Secondary | ICD-10-CM | POA: Diagnosis not present

## 2018-07-21 DIAGNOSIS — F17210 Nicotine dependence, cigarettes, uncomplicated: Secondary | ICD-10-CM | POA: Diagnosis not present

## 2018-07-21 DIAGNOSIS — F17200 Nicotine dependence, unspecified, uncomplicated: Secondary | ICD-10-CM | POA: Diagnosis not present

## 2018-07-31 ENCOUNTER — Other Ambulatory Visit: Payer: Self-pay | Admitting: Allergy and Immunology

## 2018-07-31 ENCOUNTER — Other Ambulatory Visit: Payer: Self-pay

## 2018-07-31 MED ORDER — MUPIROCIN 2 % EX OINT: TOPICAL_OINTMENT | CUTANEOUS | 0 refills | Status: DC

## 2018-07-31 NOTE — Telephone Encounter (Signed)
I called patient and she states azelastine and flonase isn't helping. She mentioned that her nose has reddness, swelling and sores that she believes is from the fluticasone. She has tried and failed dymista, fluticasone, azelastine. She has Pharmacist, hospital. Is there a nasal spray you suggest? We will most likely have to do a PA on it, as we are having trouble finding a nasal spray other than what she is on that is covered.

## 2018-07-31 NOTE — Telephone Encounter (Signed)
Carol Vazquez called in and states she needs a nose spray covered by her insurance.  She would like it sent to [facility] on [address].  Soliana states Fluticasone doesn't work.

## 2018-07-31 NOTE — Telephone Encounter (Signed)
Called patient and informed her of Dr. Bruna Potter suggestion. Also sent in prescription for mupirocin ointment.

## 2018-07-31 NOTE — Telephone Encounter (Signed)
Please inform patient to stop all nasal sprays for the next 10 days and just use nasal saline followed by mupirocin ointment in her nose about 3 times per day until everything heals up.

## 2018-08-18 DIAGNOSIS — F209 Schizophrenia, unspecified: Secondary | ICD-10-CM | POA: Diagnosis not present

## 2018-08-23 DIAGNOSIS — R52 Pain, unspecified: Secondary | ICD-10-CM | POA: Diagnosis not present

## 2018-08-23 DIAGNOSIS — Z79899 Other long term (current) drug therapy: Secondary | ICD-10-CM | POA: Diagnosis not present

## 2018-08-23 DIAGNOSIS — N390 Urinary tract infection, site not specified: Secondary | ICD-10-CM | POA: Diagnosis not present

## 2018-08-23 DIAGNOSIS — T50992A Poisoning by other drugs, medicaments and biological substances, intentional self-harm, initial encounter: Secondary | ICD-10-CM | POA: Diagnosis not present

## 2018-08-23 DIAGNOSIS — E1165 Type 2 diabetes mellitus with hyperglycemia: Secondary | ICD-10-CM | POA: Diagnosis not present

## 2018-08-23 DIAGNOSIS — D72829 Elevated white blood cell count, unspecified: Secondary | ICD-10-CM | POA: Diagnosis not present

## 2018-08-23 DIAGNOSIS — D473 Essential (hemorrhagic) thrombocythemia: Secondary | ICD-10-CM | POA: Diagnosis not present

## 2018-08-23 DIAGNOSIS — T50901A Poisoning by unspecified drugs, medicaments and biological substances, accidental (unintentional), initial encounter: Secondary | ICD-10-CM | POA: Diagnosis not present

## 2018-08-23 DIAGNOSIS — F419 Anxiety disorder, unspecified: Secondary | ICD-10-CM | POA: Diagnosis not present

## 2018-08-23 DIAGNOSIS — T887XXA Unspecified adverse effect of drug or medicament, initial encounter: Secondary | ICD-10-CM | POA: Diagnosis not present

## 2018-08-23 DIAGNOSIS — F329 Major depressive disorder, single episode, unspecified: Secondary | ICD-10-CM | POA: Diagnosis not present

## 2018-08-23 DIAGNOSIS — J45909 Unspecified asthma, uncomplicated: Secondary | ICD-10-CM | POA: Diagnosis not present

## 2018-08-23 DIAGNOSIS — T426X2A Poisoning by other antiepileptic and sedative-hypnotic drugs, intentional self-harm, initial encounter: Secondary | ICD-10-CM | POA: Diagnosis not present

## 2018-08-23 DIAGNOSIS — R740 Nonspecific elevation of levels of transaminase and lactic acid dehydrogenase [LDH]: Secondary | ICD-10-CM | POA: Diagnosis not present

## 2018-08-23 DIAGNOSIS — J42 Unspecified chronic bronchitis: Secondary | ICD-10-CM | POA: Diagnosis not present

## 2018-08-23 DIAGNOSIS — R0689 Other abnormalities of breathing: Secondary | ICD-10-CM | POA: Diagnosis not present

## 2018-08-23 DIAGNOSIS — T404X2A Poisoning by other synthetic narcotics, intentional self-harm, initial encounter: Secondary | ICD-10-CM | POA: Diagnosis not present

## 2018-08-23 DIAGNOSIS — F17210 Nicotine dependence, cigarettes, uncomplicated: Secondary | ICD-10-CM | POA: Diagnosis not present

## 2018-08-23 DIAGNOSIS — F209 Schizophrenia, unspecified: Secondary | ICD-10-CM | POA: Diagnosis not present

## 2018-08-23 DIAGNOSIS — J9690 Respiratory failure, unspecified, unspecified whether with hypoxia or hypercapnia: Secondary | ICD-10-CM | POA: Diagnosis not present

## 2018-08-23 DIAGNOSIS — T50904A Poisoning by unspecified drugs, medicaments and biological substances, undetermined, initial encounter: Secondary | ICD-10-CM | POA: Diagnosis not present

## 2018-08-23 DIAGNOSIS — E119 Type 2 diabetes mellitus without complications: Secondary | ICD-10-CM | POA: Diagnosis not present
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
## 2018-08-24 ENCOUNTER — Inpatient Hospital Stay (HOSPITAL_COMMUNITY)
Admission: AD | Admit: 2018-08-24 | Discharge: 2018-08-27 | DRG: 885 | Disposition: A | Discharge status: Home / Self Care | Payer: Medicare Other | Source: Other Acute Inpatient Hospital | Attending: Emergency Medicine | Admitting: Emergency Medicine

## 2018-08-24 ENCOUNTER — Other Ambulatory Visit: Payer: Self-pay

## 2018-08-24 ENCOUNTER — Encounter (HOSPITAL_COMMUNITY): Payer: Self-pay | Admitting: *Deleted

## 2018-08-24 DIAGNOSIS — T426X2A Poisoning by other antiepileptic and sedative-hypnotic drugs, intentional self-harm, initial encounter: Secondary | ICD-10-CM | POA: Diagnosis not present

## 2018-08-24 DIAGNOSIS — T50901A Poisoning by unspecified drugs, medicaments and biological substances, accidental (unintentional), initial encounter: Secondary | ICD-10-CM | POA: Diagnosis not present

## 2018-08-24 DIAGNOSIS — J45909 Unspecified asthma, uncomplicated: Secondary | ICD-10-CM | POA: Diagnosis not present

## 2018-08-24 DIAGNOSIS — T50992A Poisoning by other drugs, medicaments and biological substances, intentional self-harm, initial encounter: Secondary | ICD-10-CM | POA: Diagnosis not present

## 2018-08-24 DIAGNOSIS — F17210 Nicotine dependence, cigarettes, uncomplicated: Secondary | ICD-10-CM | POA: Diagnosis not present

## 2018-08-24 DIAGNOSIS — F209 Schizophrenia, unspecified: Secondary | ICD-10-CM | POA: Diagnosis not present

## 2018-08-24 DIAGNOSIS — F419 Anxiety disorder, unspecified: Secondary | ICD-10-CM | POA: Diagnosis present

## 2018-08-24 DIAGNOSIS — K219 Gastro-esophageal reflux disease without esophagitis: Secondary | ICD-10-CM | POA: Diagnosis present

## 2018-08-24 DIAGNOSIS — E119 Type 2 diabetes mellitus without complications: Secondary | ICD-10-CM | POA: Diagnosis present

## 2018-08-24 DIAGNOSIS — R945 Abnormal results of liver function studies: Secondary | ICD-10-CM | POA: Diagnosis not present

## 2018-08-24 DIAGNOSIS — Z7984 Long term (current) use of oral hypoglycemic drugs: Secondary | ICD-10-CM | POA: Diagnosis not present

## 2018-08-24 DIAGNOSIS — N390 Urinary tract infection, site not specified: Secondary | ICD-10-CM | POA: Diagnosis not present

## 2018-08-24 DIAGNOSIS — F329 Major depressive disorder, single episode, unspecified: Secondary | ICD-10-CM | POA: Diagnosis not present

## 2018-08-24 DIAGNOSIS — Z9289 Personal history of other medical treatment: Secondary | ICD-10-CM

## 2018-08-24 DIAGNOSIS — T404X2A Poisoning by other synthetic narcotics, intentional self-harm, initial encounter: Secondary | ICD-10-CM | POA: Diagnosis not present

## 2018-08-24 DIAGNOSIS — K760 Fatty (change of) liver, not elsewhere classified: Secondary | ICD-10-CM

## 2018-08-24 DIAGNOSIS — J42 Unspecified chronic bronchitis: Secondary | ICD-10-CM | POA: Diagnosis not present

## 2018-08-24 HISTORY — DX: Depression, unspecified: F32.A

## 2018-08-24 HISTORY — DX: Anxiety disorder, unspecified: F41.9

## 2018-08-24 MED ORDER — MAGNESIUM HYDROXIDE 400 MG/5ML PO SUSP: 30.0000 mL | Freq: Every day | ORAL | Status: DC | PRN

## 2018-08-24 MED ORDER — CYCLOBENZAPRINE HCL 10 MG PO TABS
10.0000 mg | ORAL_TABLET | Freq: Three times a day (TID) | ORAL | Status: DC | PRN
  Administered 2018-08-24 – 2018-08-26 (×5): 10 mg via ORAL
  Filled 2018-08-24 (×4): qty 1

## 2018-08-24 MED ORDER — TRAZODONE HCL 50 MG PO TABS
50.0000 mg | ORAL_TABLET | Freq: Every evening | ORAL | Status: DC | PRN
  Administered 2018-08-25: 50 mg via ORAL
  Filled 2018-08-24: qty 1

## 2018-08-24 MED ORDER — ZIPRASIDONE HCL 40 MG PO CAPS
40.0000 mg | ORAL_CAPSULE | Freq: Two times a day (BID) | ORAL | Status: DC
  Administered 2018-08-24 – 2018-08-27 (×6): 40 mg via ORAL
  Filled 2018-08-24: qty 2
  Filled 2018-08-24 (×10): qty 1
  Filled 2018-08-24: qty 2

## 2018-08-24 MED ORDER — HYDROXYZINE HCL 25 MG PO TABS
25.0000 mg | ORAL_TABLET | Freq: Three times a day (TID) | ORAL | Status: DC | PRN
  Administered 2018-08-24 – 2018-08-26 (×2): 25 mg via ORAL
  Filled 2018-08-24 (×2): qty 1

## 2018-08-24 MED ORDER — PROSIGHT PO TABS
1.0000 | ORAL_TABLET | Freq: Every day | ORAL | Status: DC
  Administered 2018-08-25 – 2018-08-27 (×3): 1 via ORAL
  Filled 2018-08-24 (×7): qty 1

## 2018-08-24 MED ORDER — NAPROXEN 375 MG PO TABS
375.0000 mg | ORAL_TABLET | Freq: Two times a day (BID) | ORAL | Status: DC
  Administered 2018-08-24 – 2018-08-27 (×6): 375 mg via ORAL
  Filled 2018-08-24 (×12): qty 1

## 2018-08-24 MED ORDER — LORATADINE 10 MG PO TABS
10.0000 mg | ORAL_TABLET | Freq: Every day | ORAL | Status: DC
  Administered 2018-08-24 – 2018-08-27 (×4): 10 mg via ORAL
  Filled 2018-08-24 (×8): qty 1

## 2018-08-24 MED ORDER — ACETAMINOPHEN 325 MG PO TABS: 650.0000 mg | ORAL_TABLET | Freq: Four times a day (QID) | ORAL | Status: DC | PRN

## 2018-08-24 MED ORDER — ALUM & MAG HYDROXIDE-SIMETH 200-200-20 MG/5ML PO SUSP: 30.0000 mL | ORAL | Status: DC | PRN

## 2018-08-24 MED ORDER — MOMETASONE FURO-FORMOTEROL FUM 200-5 MCG/ACT IN AERO
2.0000 | INHALATION_SPRAY | Freq: Two times a day (BID) | RESPIRATORY_TRACT | Status: DC
  Administered 2018-08-24 – 2018-08-27 (×6): 2 via RESPIRATORY_TRACT
  Filled 2018-08-24 (×2): qty 8.8

## 2018-08-24 MED ORDER — ALBUTEROL SULFATE HFA 108 (90 BASE) MCG/ACT IN AERS: 1.0000 | INHALATION_SPRAY | Freq: Four times a day (QID) | RESPIRATORY_TRACT | Status: DC | PRN

## 2018-08-24 MED ORDER — FLUOXETINE HCL 20 MG PO CAPS
40.0000 mg | ORAL_CAPSULE | Freq: Every day | ORAL | Status: DC
  Administered 2018-08-24 – 2018-08-27 (×4): 40 mg via ORAL
  Filled 2018-08-24 (×8): qty 2

## 2018-08-24 MED ORDER — TIOTROPIUM BROMIDE MONOHYDRATE 18 MCG IN CAPS
18.0000 ug | ORAL_CAPSULE | Freq: Every day | RESPIRATORY_TRACT | Status: DC
  Administered 2018-08-24 – 2018-08-27 (×4): 18 ug via RESPIRATORY_TRACT
  Filled 2018-08-24 (×3): qty 5

## 2018-08-24 MED ORDER — POTASSIUM CHLORIDE CRYS ER 10 MEQ PO TBCR
10.0000 meq | EXTENDED_RELEASE_TABLET | Freq: Every day | ORAL | Status: DC
  Administered 2018-08-24 – 2018-08-27 (×4): 10 meq via ORAL
  Filled 2018-08-24 (×9): qty 1

## 2018-08-24 MED ORDER — CALCIUM CARBONATE 1250 (500 CA) MG PO TABS
1250.0000 mg | ORAL_TABLET | Freq: Every day | ORAL | Status: DC
  Administered 2018-08-25 – 2018-08-27 (×3): 1250 mg via ORAL
  Filled 2018-08-24 (×6): qty 1

## 2018-08-24 MED ORDER — FLUPHENAZINE HCL 5 MG PO TABS
5.0000 mg | ORAL_TABLET | Freq: Every day | ORAL | Status: DC
  Administered 2018-08-24 – 2018-08-26 (×3): 5 mg via ORAL
  Filled 2018-08-24 (×6): qty 1
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
## 2018-08-24 NOTE — Plan of Care (Signed)
Nurse discussed anxiety, depression, coping skills with patient. 

## 2018-08-24 NOTE — Progress Notes (Signed)
EKG completed and placed on MD desk for review.

## 2018-08-24 NOTE — Tx Team (Signed)
Initial Treatment Plan 08/24/2018 6:08 PM Carol Vazquez [id number]    PATIENT STRESSORS: Health problems Medication change or noncompliance   PATIENT STRENGTHS: Occupational psychologist fund of knowledge Motivation for treatment/growth Physical Health Supportive family/friends   PATIENT IDENTIFIED PROBLEMS: "to work on my mind"  "took too many pills"  "thoughts to hurt myself"                 DISCHARGE CRITERIA:  Ability to meet basic life and health needs Adequate post-discharge living arrangements Improved stabilization in mood, thinking, and/or behavior Medical problems require only outpatient monitoring Motivation to continue treatment in a less acute level of care Need for constant or close observation no longer present Reduction of life-threatening or endangering symptoms to within safe limits Safe-care adequate arrangements made Verbal commitment to aftercare and medication compliance  PRELIMINARY DISCHARGE PLAN: Attend aftercare/continuing care group Attend PHP/IOP Outpatient therapy Return to previous living arrangement  PATIENT/FAMILY INVOLVEMENT: This treatment plan has been presented to and reviewed with the patient, Carol Vazquez.  The patient and family have been given the opportunity to ask questions and make suggestions.  Grayland Ormond [place], [place] 08/24/2018, 6:08 PM

## 2018-08-24 NOTE — Progress Notes (Signed)
Patient asked about tramadol and flucitasone nasal spray, stated she takes this at home.

## 2018-08-24 NOTE — Progress Notes (Signed)
D: Pt was in bed in her room upon initial approach.  Pt presents with anxious affect and mood.  Writer woke pt up to do assessment and she screamed loudly.  This happened again with MHT.  Pt denies SI/HI, denies hallucinations, denies pain.  Pt has been isolative to her room for majority of the evening.   A: Introduced self to pt.  Met with pt 1:1.  Actively listened to pt and offered support and encouragement.  Medications administered per order.  PRN medication administered for anxiety.  Q15 minute safety checks maintained.  R: Pt is safe on the unit.  Pt is compliant with medications.  Pt verbally contracts for safety.  She reports she will inform staff of needs and concerns.  Will continue to monitor and assess.

## 2018-08-24 NOTE — Progress Notes (Signed)
   08/24/18 2348  COVID-19 Daily Checkoff  Have you had a fever (temp > 37.80C/100F)  in the past 24 hours?  No  COVID-19 EXPOSURE  Have you traveled outside the state in the past 14 days? No  Have you been in contact with someone with a confirmed diagnosis of COVID-19 or PUI in the past 14 days without wearing appropriate PPE? No  Have you been living in the same home as a person with confirmed diagnosis of COVID-19 or a PUI (household contact)? No  Have you been diagnosed with COVID-19? No

## 2018-08-25 ENCOUNTER — Inpatient Hospital Stay (HOSPITAL_COMMUNITY): Payer: Medicare Other

## 2018-08-25 DIAGNOSIS — F209 Schizophrenia, unspecified: Principal | ICD-10-CM

## 2018-08-25 LAB — URINALYSIS, COMPLETE (UACMP) WITH MICROSCOPIC
Bilirubin Urine: NEGATIVE
Glucose, UA: NEGATIVE mg/dL
Ketones, ur: NEGATIVE mg/dL
Nitrite: NEGATIVE
Protein, ur: NEGATIVE mg/dL
Specific Gravity, Urine: 1.015 (ref 1.005–1.030)
pH: 6 (ref 5.0–8.0)

## 2018-08-25 LAB — CBC
HCT: 43.3 % (ref 36.0–46.0)
Hemoglobin: 14 g/dL (ref 12.0–15.0)
MCH: 32.5 pg (ref 26.0–34.0)
MCHC: 32.3 g/dL (ref 30.0–36.0)
MCV: 100.5 fL — ABNORMAL HIGH (ref 80.0–100.0)
Platelets: 414 10*3/uL — ABNORMAL HIGH (ref 150–400)
RBC: 4.31 MIL/uL (ref 3.87–5.11)
RDW: 14.1 % (ref 11.5–15.5)
WBC: 15.3 10*3/uL — ABNORMAL HIGH (ref 4.0–10.5)
nRBC: 0 % (ref 0.0–0.2)

## 2018-08-25 LAB — COMPREHENSIVE METABOLIC PANEL
ALT: 63 U/L — ABNORMAL HIGH (ref 0–44)
AST: 58 U/L — ABNORMAL HIGH (ref 15–41)
Albumin: 4.3 g/dL (ref 3.5–5.0)
Alkaline Phosphatase: 125 U/L (ref 38–126)
Anion gap: 12 (ref 5–15)
BUN: 8 mg/dL (ref 6–20)
CO2: 26 mmol/L (ref 22–32)
Calcium: 9.6 mg/dL (ref 8.9–10.3)
Chloride: 102 mmol/L (ref 98–111)
Creatinine, Ser: 0.72 mg/dL (ref 0.44–1.00)
GFR calc Af Amer: >60 mL/min (ref >60)
GFR calc non Af Amer: >60 mL/min (ref >60)
Glucose, Bld: 110 mg/dL — ABNORMAL HIGH (ref 70–99)
Potassium: 4.5 mmol/L (ref 3.5–5.1)
Sodium: 140 mmol/L (ref 135–145)
Total Bilirubin: 0.7 mg/dL (ref 0.3–1.2)
Total Protein: 7.8 g/dL (ref 6.5–8.1)

## 2018-08-25 LAB — LIPID PANEL
Cholesterol: 250 mg/dL — ABNORMAL HIGH (ref 0–200)
HDL: 36 mg/dL — ABNORMAL LOW (ref >40)
LDL Cholesterol: 151 mg/dL — ABNORMAL HIGH (ref 0–99)
Total CHOL/HDL Ratio: 6.9 RATIO
Triglycerides: 317 mg/dL — ABNORMAL HIGH (ref <150)
VLDL: 63 mg/dL — ABNORMAL HIGH (ref 0–40)

## 2018-08-25 LAB — HEMOGLOBIN A1C
Hgb A1c MFr Bld: 6 % — ABNORMAL HIGH (ref 4.8–5.6)
Mean Plasma Glucose: 125.5 mg/dL

## 2018-08-25 LAB — GLUCOSE, CAPILLARY
Glucose-Capillary: 130 mg/dL — ABNORMAL HIGH (ref 70–99)
Glucose-Capillary: 95 mg/dL (ref 70–99)

## 2018-08-25 LAB — TSH: TSH: 1.976 u[IU]/mL (ref 0.350–4.500)

## 2018-08-25 MED ORDER — TRAMADOL HCL 50 MG PO TABS: 50.0000 mg | ORAL_TABLET | Freq: Two times a day (BID) | ORAL | Status: DC | PRN

## 2018-08-25 MED ORDER — SULFAMETHOXAZOLE-TRIMETHOPRIM 400-80 MG PO TABS
1.0000 | ORAL_TABLET | Freq: Two times a day (BID) | ORAL | Status: DC
  Administered 2018-08-25 – 2018-08-27 (×5): 1 via ORAL
  Filled 2018-08-25 (×9): qty 1

## 2018-08-25 MED ORDER — OXYBUTYNIN CHLORIDE 5 MG PO TABS
2.5000 mg | ORAL_TABLET | Freq: Three times a day (TID) | ORAL | Status: DC
  Administered 2018-08-25 – 2018-08-27 (×6): 2.5 mg via ORAL
  Filled 2018-08-25 (×12): qty 0.5

## 2018-08-25 MED ORDER — NICOTINE 21 MG/24HR TD PT24
21.0000 mg | MEDICATED_PATCH | Freq: Every day | TRANSDERMAL | Status: DC
  Administered 2018-08-25 – 2018-08-27 (×3): 21 mg via TRANSDERMAL
  Filled 2018-08-25 (×6): qty 1

## 2018-08-25 MED ORDER — METFORMIN HCL 500 MG PO TABS
500.0000 mg | ORAL_TABLET | Freq: Every day | ORAL | Status: DC
  Administered 2018-08-25 – 2018-08-27 (×3): 500 mg via ORAL
  Filled 2018-08-25 (×6): qty 1

## 2018-08-25 NOTE — ED Notes (Signed)
Pt arrives from [facility] for an ultrasound of her belly. Denies N/V/D.

## 2018-08-25 NOTE — BHH Suicide Risk Assessment (Signed)
[place] INPATIENT:  Family/Significant Other Suicide Prevention Education  Suicide Prevention Education:  Education Completed; Pt's mother, Reeshemah Nazaryan [phone]),  (name of family member/significant other) has been identified by the patient as the family member/significant other with whom the patient will be residing, and identified as the person(s) who will aid the patient in the event of a mental health crisis (suicidal ideations/suicide attempt).  With written consent from the patient, the family member/significant other has been provided the following suicide prevention education, prior to the and/or following the discharge of the patient.  The suicide prevention education provided includes the following:  Suicide risk factors  Suicide prevention and interventions  National Suicide Hotline telephone number  [facility] assessment telephone number  [facility] Emergency Assistance [place] and/or Residential Mobile Crisis Unit telephone number  Request made of family/significant other to:  Remove weapons (e.g., guns, rifles, knives), all items previously/currently identified as safety concern.    Remove drugs/medications (over-the-counter, prescriptions, illicit drugs), all items previously/currently identified as a safety concern.  The family member/significant other verbalizes understanding of the suicide prevention education information provided.  The family member/significant other agrees to remove the items of safety concern listed above.  CSW contacted pt's mother, Najma Bozarth. CSW let pt's mother know that the patient may be discharging tomorrow and how she felt about that. Pt's mother reports that she is fine with that. Pt's mother reported that the patient accidentally took too many pills. She took her pills and then tried to retake them resulting in taking too many. Pt's mom reported that the pt could not breathe so she called 911. Pt's  mother reported that she is willing to come get her. Pt's mother reported concerns with the EKG that the patient had at the hospital and wondered if that would get sent over to Dr. Venetia Maxon whom is her PCP at [facility]. Pt's mother reports the patient have some heart concerns in the past. CSW notified the physician on the unit and called the mother back to let her know that tomorrow is a possible discharge date.   Trecia Rogers 08/25/2018, 12:49 PM

## 2018-08-25 NOTE — ED Notes (Signed)
Bed: [facility] Expected date:  Expected time:  Means of arrival:  Comments: Ready for zapping

## 2018-08-25 NOTE — Progress Notes (Signed)
Patient's CBG at 1200 is 130.

## 2018-08-25 NOTE — ED Provider Notes (Signed)
[facility] DEPT Provider Note   CSN: [id number] Arrival date & time: 08/25/18  1934    History   Chief Complaint Chief Complaint  Patient presents with  . Schizophrenia    HPI Carol Vazquez is a 43 y.o. female.     The history is provided by the patient and medical records. No language interpreter was used.  Illness  Location:  Abdomen Quality:  None, here for ultrasound Severity:  Unable to specify Onset quality:  Unable to specify Timing:  Unable to specify Progression:  Unable to specify Chronicity:  New Associated symptoms: no abdominal pain, no chest pain, no congestion, no cough, no diarrhea, no fatigue, no fever, no headaches, no loss of consciousness, no nausea, no rash, no rhinorrhea, no shortness of breath and no vomiting     Past Medical History:  Diagnosis Date  . Allergic rhinitis   . Anxiety   . Asthma   . Depression   . GERD (gastroesophageal reflux disease)   . Tobacco abuse     Patient Active Problem List   Diagnosis Date Noted  . Schizophrenia ([place]) 08/24/2018    History reviewed. No pertinent surgical history.   OB History   No obstetric history on file.      Home Medications    Prior to Admission medications   Medication Sig Start Date End Date Taking? Authorizing Provider  albuterol (PROAIR HFA) 108 (90 Base) MCG/ACT inhaler USE 2 INHALATIONS EVERY 4 TO 6 HOURS AS NEEDED FOR COUGH OR WHEEZE 12/26/17  Yes Kozlow, Donnamarie Poag, MD  atorvastatin (LIPITOR) 10 MG tablet Take 10 mg by mouth.  02/06/18  Yes [provider]  calcium carbonate (OS-CAL - DOSED IN MG OF ELEMENTAL CALCIUM) 1250 (500 Ca) MG tablet Take 1 tablet by mouth daily.   Yes [provider]  cetirizine (ZYRTEC) 10 MG tablet Can take one tablet once daily if needed. 05/01/18  Yes Kozlow, Donnamarie Poag, MD  Collagen-Vitamin C (COLLAGEN PLUS VITAMIN C) 740-125 MG CAPS Take 1 tablet by mouth daily.   Yes [provider]   cyclobenzaprine (FLEXERIL) 10 MG tablet Take 1 tablet by mouth 3 (three) times daily as needed. 06/17/18  Yes [provider]  EPINEPHrine (EPIPEN 2-PAK) 0.3 mg/0.3 mL IJ SOAJ injection Use as directed for life-threatening allergic reaction. 12/26/17  Yes Kozlow, Donnamarie Poag, MD  FLUoxetine (PROZAC) 20 MG capsule Take 40 mg by mouth daily.    Yes [provider]  fluPHENAZine (PROLIXIN) 5 MG tablet Take 1 tablet by mouth at bedtime. 08/18/18  Yes [provider]  fluticasone (FLONASE) 50 MCG/ACT nasal spray Use one spray in each nostril once daily. 05/01/18  Yes Kozlow, Donnamarie Poag, MD  fluticasone-salmeterol (ADVAIR HFA) [phone] MCG/ACT inhaler Inhale two puffs into the lungs twice daily to prevent cough or wheeze.  Rinse, gargle, and spit after use. 05/01/18  Yes Kozlow, Donnamarie Poag, MD  montelukast (SINGULAIR) 10 MG tablet Take one tablet once daily 05/01/18  Yes Kozlow, Donnamarie Poag, MD  omeprazole (PRILOSEC) 40 MG capsule Take 1 capsule (40 mg total) by mouth 2 (two) times daily. 05/01/18  Yes Kozlow, Donnamarie Poag, MD  oxybutynin (DITROPAN XL) 15 MG 24 hr tablet Take 1 tablet by mouth daily. 07/21/18  Yes [provider]  traMADol (ULTRAM) 50 MG tablet 50 mg 2 (two) times daily.  03/25/18  Yes [provider]  ziprasidone (GEODON) 40 MG capsule Take 1 capsule by mouth 2 (two) times a day.  08/18/18  Yes [provider]    Family History Family History  Problem Relation Age of Onset  . Asthma Mother   . Allergic rhinitis Mother     Social History Social History   Tobacco Use  . Smoking status: Current Every Day Smoker    Packs/day: 1.00    Years: 24.00    Pack years: 24.00    Types: Cigarettes  . Smokeless tobacco: Never Used  Substance Use Topics  . Alcohol use: No  . Drug use: Never     Allergies   Ivp dye [iodinated diagnostic agents]; Mobic [meloxicam]; Penicillins; and Seroquel [quetiapine fumarate]   Review of Systems Review of Systems  Constitutional:  Negative for chills, diaphoresis, fatigue and fever.  HENT: Negative for congestion and rhinorrhea.   Respiratory: Negative for cough, chest tightness and shortness of breath.   Cardiovascular: Negative for chest pain and palpitations.  Gastrointestinal: Negative for abdominal pain, constipation, diarrhea, nausea and vomiting.  Genitourinary: Negative for dysuria and frequency.  Musculoskeletal: Negative for back pain, neck pain and neck stiffness.  Skin: Negative for rash and wound.  Neurological: Negative for loss of consciousness, light-headedness, numbness and headaches.  Psychiatric/Behavioral: Negative for agitation and confusion.  All other systems reviewed and are negative.    Physical Exam Updated Vital Signs BP (!) 158/96 (BP Location: Left Arm)   Pulse (!) 116   Temp 98.4 F (36.9 C) (Oral)   Resp 20   Ht 5' (1.524 m)   Wt 78.5 kg   LMP 08/10/2018 (Approximate)   SpO2 94%   BMI 33.79 kg/m   Physical Exam Vitals signs and nursing note reviewed.  Constitutional:      General: She is not in acute distress.    Appearance: She is well-developed. She is not ill-appearing, toxic-appearing or diaphoretic.  HENT:     Head: Normocephalic and atraumatic.     Nose: No congestion.  Eyes:     Conjunctiva/sclera: Conjunctivae normal.  Neck:     Musculoskeletal: Neck supple.  Cardiovascular:     Rate and Rhythm: Normal rate and regular rhythm.     Pulses: Normal pulses.     Heart sounds: No murmur.  Pulmonary:     Effort: Pulmonary effort is normal. No respiratory distress.     Breath sounds: Normal breath sounds. No wheezing, rhonchi or rales.  Chest:     Chest wall: No tenderness.  Abdominal:     Palpations: Abdomen is soft.     Tenderness: There is no abdominal tenderness.  Skin:    General: Skin is warm and dry.     Capillary Refill: Capillary refill takes less than 2 seconds.     Findings: No rash.  Neurological:     General: No focal deficit present.      Mental Status: She is alert.  Psychiatric:        Mood and Affect: Mood normal.      ED Treatments / Results  Labs (all labs ordered are listed, but only abnormal results are displayed) Labs Reviewed  CBC - Abnormal; Notable for the following components:      Result Value   WBC 15.3 (*)    MCV 100.5 (*)    Platelets 414 (*)    All other components within normal limits  COMPREHENSIVE METABOLIC PANEL - Abnormal; Notable for the following components:   Glucose, Bld 110 (*)    AST 58 (*)    ALT 63 (*)    All other  components within normal limits  HEMOGLOBIN A1C - Abnormal; Notable for the following components:   Hgb A1c MFr Bld 6.0 (*)    All other components within normal limits  LIPID PANEL - Abnormal; Notable for the following components:   Cholesterol 250 (*)    Triglycerides 317 (*)    HDL 36 (*)    VLDL 63 (*)    LDL Cholesterol 151 (*)    All other components within normal limits  URINALYSIS, COMPLETE (UACMP) WITH MICROSCOPIC - Abnormal; Notable for the following components:   APPearance HAZY (*)    Hgb urine dipstick SMALL (*)    Leukocytes,Ua SMALL (*)    Bacteria, UA RARE (*)    All other components within normal limits  GLUCOSE, CAPILLARY - Abnormal; Notable for the following components:   Glucose-Capillary 130 (*)    All other components within normal limits  TSH  GLUCOSE, CAPILLARY  PROLACTIN    EKG None  Radiology No results found.  Procedures Procedures (including critical care time)  Medications Ordered in ED Medications  acetaminophen (TYLENOL) tablet 650 mg (has no administration in time range)  alum & mag hydroxide-simeth (MAALOX/MYLANTA) 200-200-20 MG/5ML suspension 30 mL (has no administration in time range)  magnesium hydroxide (MILK OF MAGNESIA) suspension 30 mL (has no administration in time range)  hydrOXYzine (ATARAX/VISTARIL) tablet 25 mg (25 mg Oral Given 08/24/18 2137)  traZODone (DESYREL) tablet 50 mg (has no administration in time  range)  FLUoxetine (PROZAC) capsule 40 mg (40 mg Oral Given 08/25/18 0757)  ziprasidone (GEODON) capsule 40 mg (40 mg Oral Given 08/25/18 0802)  fluPHENAZine (PROLIXIN) tablet 5 mg (5 mg Oral Given 08/24/18 2137)  tiotropium (SPIRIVA) inhalation capsule ([facility] use ONLY) 18 mcg (18 mcg Inhalation Given 08/25/18 0801)  mometasone-formoterol (DULERA) 200-5 MCG/ACT inhaler 2 puff (2 puffs Inhalation Given 08/25/18 0802)  albuterol (VENTOLIN HFA) 108 (90 Base) MCG/ACT inhaler 1-2 puff (has no administration in time range)  loratadine (CLARITIN) tablet 10 mg (10 mg Oral Given 08/25/18 0758)  cyclobenzaprine (FLEXERIL) tablet 10 mg (10 mg Oral Given 08/25/18 0812)  calcium carbonate (OS-CAL - dosed in mg of elemental calcium) tablet 1,250 mg (1,250 mg Oral Given 08/25/18 0758)  naproxen (NAPROSYN) tablet 375 mg (375 mg Oral Given 08/25/18 0757)  potassium chloride (K-DUR) CR tablet 10 mEq (10 mEq Oral Given 08/25/18 0801)  multivitamin (PROSIGHT) tablet 1 tablet (1 tablet Oral Given 08/25/18 0807)  nicotine (NICODERM CQ - dosed in mg/24 hours) patch 21 mg (21 mg Transdermal Patch Applied 08/25/18 0803)  metFORMIN (GLUCOPHAGE) tablet 500 mg (500 mg Oral Given 08/25/18 1214)  traMADol (ULTRAM) tablet 50 mg (has no administration in time range)  oxybutynin (DITROPAN) tablet 2.5 mg (2.5 mg Oral Given 08/25/18 1212)  sulfamethoxazole-trimethoprim (BACTRIM) 400-80 MG per tablet 1 tablet (1 tablet Oral Given 08/25/18 1213)     Initial Impression / Assessment and Plan / ED Course  I have reviewed the triage vital signs and the nursing notes.  Pertinent labs & imaging results that were available during my care of the patient were reviewed by me and considered in my medical decision making (see chart for details).        Carol Vazquez is a 43 y.o. female with a past medical history significant for schizophrenia, anxiety, depression, and GERD who is currently admitted at behavioral Hospital who presents to the  emergency department so she can get an abdominal ultrasound.  Patient reports that she is having no symptoms currently.  She reports that her doctor at [facility] wanted her to have an abdominal ultrasound today and the only way they can do it was to have her come to the emergency department.  She currently denies any chest pain, shortness breath, fevers, chills, domino pain, nausea, vomiting, dysuria, or hematuria.  She reports that they wanted the ultrasound because she is on some any medicines.  She currently has no complaints.  According to nursing, patient was in the emergency department get the ultrasound and then will be discharged and transferred back to the behavioral health Hospital to continue her admission.  On my exam, lungs clear and abdomen nontender.  Chest nontender.  Normal bowel sounds.  Lungs clear.  Patient well-appearing and has no complaints.  We will let the patient get the ultrasound she was sent over for and then as she has no complaints, she will be transferred back to continue her admission in the behavioral health hospital.  Patient was informed to let us know of any new complaints or problems before she is transferred back.  Nursing reports that the ordering physician at behavioral health will follow-up on the results and we do not need to wait for the results or act on them at this time.  Anticipate transfer back after ultrasound is performed.  8:09 PM Nursing reports that patient still has no complaints and had her ultrasound completed without difficulty.  Patient will be transferred back to her facility.   Final Clinical Impressions(s) / ED Diagnoses   Final diagnoses:  H/O diagnostic ultrasound     Clinical Impression: 1. H/O diagnostic ultrasound   2. Fatty liver disease, nonalcoholic     Disposition: Transfer back to her admission at behavioral Hospital after her ultrasound was completed  This note was prepared with assistance of Dragon voice recognition  software. Occasional wrong-word or sound-a-like substitutions may have occurred due to the inherent limitations of voice recognition software.      Roselie Cirigliano, Gwenyth Allegra, MD 08/25/18 2012

## 2018-08-25 NOTE — Progress Notes (Signed)
D:  Patient's self inventory sheet, patient sleeps good, no sleep medication.  Good appetite, normal energy level, good concentration.  Denied depression and hopeless, anxiety #3.  Denied withdrawals.  Denied SI.  Denied physical problems.  Physical pain, legs #5.  Pain medicine is helpful.  Goal is discharge soon.  Unsure of plans. A:  Medications administered per MD orders.  Emotional support and encouragement given patient. R:  Denied SI and HI, contracts for safety.  Denied A/V hallucinations.  Safety maintained with 15 minute checks.
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
## 2018-08-25 NOTE — Progress Notes (Signed)
Patient requested tramadol that she was taking at home.

## 2018-08-25 NOTE — Progress Notes (Signed)
Nurse called [phone] and set up appointment for US abdomen tonight at 7:30 p.m.  Go to [facility] ED and tell ED that patient is not to be seen in ED.  Check patient in as outpatient and proceed to [place] because they are expecting patient at 7:30 pm.   Patient is not to eat/drink until after the [place] is completed.  Patient agreed.  Patient's UA placed in lab refrigerator for pick up.

## 2018-08-25 NOTE — ED Notes (Signed)
Bed: [facility]PT3 Expected date:  Expected time:  Means of arrival:  Comments: 
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
## 2018-08-25 NOTE — ED Notes (Signed)
Pt alert and oriented x 3, behavioral sitter with pt called [facility] to take back to behavioral health.  No respiratory or acute distress noted alert and oriented steady gait noted.

## 2018-08-25 NOTE — Plan of Care (Signed)
Nurse discussed anxiety, depression, coping skills with patient. 

## 2018-08-25 NOTE — Progress Notes (Signed)
Recreation Therapy Notes  Date:  5.11.20 Time: 0930 Location: 300 Hall Dayroom  Group Topic: Stress Management  Goal Area(s) Addresses:  Patient will identify positive stress management techniques. Patient will identify benefits of using stress management post d/c.  Intervention: Stress Management  Activity :  Meditation.  LRT introduced the stress management technique of meditation.  LRT played a meditation that focused on making the most of your day.  Patients were to listen and follow along as meditation played.  Education:  Stress Management, Discharge Planning.   Education Outcome: Acknowledges Education  Clinical Observations/Feedback:  Pt did not attend group session.    Victorino Sparrow, LRT/CTRS         Ria Comment, Sinai Mahany A 08/25/2018 11:06 AM

## 2018-08-25 NOTE — Tx Team (Signed)
Interdisciplinary Treatment and Diagnostic Plan Update  08/25/2018 Time of Session: 0935 Carol Vazquez MRN: [id number]  Principal Diagnosis: <principal problem not specified>  Secondary Diagnoses: Active Problems:   Schizophrenia ([place])   Current Medications:  Current Facility-Administered Medications  Medication Dose Route Frequency Provider Last Rate Last Dose  . acetaminophen (TYLENOL) tablet 650 mg  650 mg Oral Q6H PRN Sharma Covert, MD      . albuterol (VENTOLIN HFA) 108 (90 Base) MCG/ACT inhaler 1-2 puff  1-2 puff Inhalation Q6H PRN Sharma Covert, MD      . alum & mag hydroxide-simeth (MAALOX/MYLANTA) 200-200-20 MG/5ML suspension 30 mL  30 mL Oral Q4H PRN Sharma Covert, MD      . calcium carbonate (OS-CAL - dosed in mg of elemental calcium) tablet 1,250 mg  1,250 mg Oral Q breakfast Sharma Covert, MD   1,250 mg at 08/25/18 0758  . cyclobenzaprine (FLEXERIL) tablet 10 mg  10 mg Oral TID PRN Sharma Covert, MD   10 mg at 08/25/18 6433  . FLUoxetine (PROZAC) capsule 40 mg  40 mg Oral Daily Sharma Covert, MD   40 mg at 08/25/18 0757  . fluPHENAZine (PROLIXIN) tablet 5 mg  5 mg Oral QHS Sharma Covert, MD   5 mg at 08/24/18 2137  . hydrOXYzine (ATARAX/VISTARIL) tablet 25 mg  25 mg Oral TID PRN Sharma Covert, MD   25 mg at 08/24/18 2137  . loratadine (CLARITIN) tablet 10 mg  10 mg Oral Daily Sharma Covert, MD   10 mg at 08/25/18 0758  . magnesium hydroxide (MILK OF MAGNESIA) suspension 30 mL  30 mL Oral Daily PRN Sharma Covert, MD      . metFORMIN (GLUCOPHAGE) tablet 500 mg  500 mg Oral Q breakfast Sharma Covert, MD   500 mg at 08/25/18 1214  . mometasone-formoterol (DULERA) 200-5 MCG/ACT inhaler 2 puff  2 puff Inhalation BID Sharma Covert, MD   2 puff at 08/25/18 0802  . multivitamin (PROSIGHT) tablet 1 tablet  1 tablet Oral Daily Sharma Covert, MD   1 tablet at 08/25/18 0807  . naproxen (NAPROSYN) tablet 375 mg  375 mg Oral  BID WC Sharma Covert, MD   375 mg at 08/25/18 0757  . nicotine (NICODERM CQ - dosed in mg/24 hours) patch 21 mg  21 mg Transdermal Daily Sharma Covert, MD   21 mg at 08/25/18 0803  . oxybutynin (DITROPAN) tablet 2.5 mg  2.5 mg Oral TID Sharma Covert, MD   2.5 mg at 08/25/18 1212  . potassium chloride (K-DUR) CR tablet 10 mEq  10 mEq Oral Daily Sharma Covert, MD   10 mEq at 08/25/18 0801  . sulfamethoxazole-trimethoprim (BACTRIM) 400-80 MG per tablet 1 tablet  1 tablet Oral Q12H Sharma Covert, MD   1 tablet at 08/25/18 1213  . tiotropium (SPIRIVA) inhalation capsule ([facility] use ONLY) 18 mcg  18 mcg Inhalation Daily Sharma Covert, MD   18 mcg at 08/25/18 0801  . traMADol (ULTRAM) tablet 50 mg  50 mg Oral Q12H PRN Sharma Covert, MD      . traZODone (DESYREL) tablet 50 mg  50 mg Oral QHS PRN Sharma Covert, MD      . ziprasidone (GEODON) capsule 40 mg  40 mg Oral BID WC Sharma Covert, MD   40 mg at 08/25/18 0802   PTA Medications: Medications Prior to Admission  Medication  Sig Dispense Refill Last Dose  . albuterol (PROAIR HFA) 108 (90 Base) MCG/ACT inhaler USE 2 INHALATIONS EVERY 4 TO 6 HOURS AS NEEDED FOR COUGH OR WHEEZE 3 Inhaler 0 Past Week at Unknown time  . atorvastatin (LIPITOR) 10 MG tablet Take 10 mg by mouth.    Past Week at Unknown time  . calcium carbonate (OS-CAL - DOSED IN MG OF ELEMENTAL CALCIUM) 1250 (500 Ca) MG tablet Take 1 tablet by mouth daily.   Past Week at Unknown time  . cetirizine (ZYRTEC) 10 MG tablet Can take one tablet once daily if needed. 90 tablet 1 Past Week at Unknown time  . Collagen-Vitamin C (COLLAGEN PLUS VITAMIN C) 740-125 MG CAPS Take 1 tablet by mouth daily.   Past Week at Unknown time  . cyclobenzaprine (FLEXERIL) 10 MG tablet Take 1 tablet by mouth 3 (three) times daily as needed.   Past Week at Unknown time  . EPINEPHrine (EPIPEN 2-PAK) 0.3 mg/0.3 mL IJ SOAJ injection Use as directed for life-threatening allergic  reaction. 2 Device 1 Past Month at Unknown time  . FLUoxetine (PROZAC) 20 MG capsule Take 40 mg by mouth daily.    Past Week at Unknown time  . fluPHENAZine (PROLIXIN) 5 MG tablet Take 1 tablet by mouth at bedtime.   Past Week at Unknown time  . fluticasone (FLONASE) 50 MCG/ACT nasal spray Use one spray in each nostril once daily. 48 g 1 Past Week at Unknown time  . fluticasone-salmeterol (ADVAIR HFA) 115-21 MCG/ACT inhaler Inhale two puffs into the lungs twice daily to prevent cough or wheeze.  Rinse, gargle, and spit after use. 3 Inhaler 1 Past Week at Unknown time  . montelukast (SINGULAIR) 10 MG tablet Take one tablet once daily 90 tablet 1 Past Week at Unknown time  . omeprazole (PRILOSEC) 40 MG capsule Take 1 capsule (40 mg total) by mouth 2 (two) times daily. 180 capsule 1 Past Week at Unknown time  . oxybutynin (DITROPAN XL) 15 MG 24 hr tablet Take 1 tablet by mouth daily.   Past Week at Unknown time  . traMADol (ULTRAM) 50 MG tablet 50 mg 2 (two) times daily.    Past Week at Unknown time  . ziprasidone (GEODON) 40 MG capsule Take 1 capsule by mouth 2 (two) times a day.   Past Week at Unknown time    Patient Stressors: Health problems Medication change or noncompliance  Patient Strengths: Occupational psychologist fund of knowledge Motivation for treatment/growth Physical Health Supportive family/friends  Treatment Modalities: Medication Management, Group therapy, Case management,  1 to 1 session with clinician, Psychoeducation, Recreational therapy.   Physician Treatment Plan for Primary Diagnosis: <principal problem not specified> Long Term Goal(s): Improvement in symptoms so as ready for discharge Improvement in symptoms so as ready for discharge   Short Term Goals: Ability to identify changes in lifestyle to reduce recurrence of condition will improve Ability to verbalize feelings will improve Ability to disclose and discuss suicidal ideas Ability to  demonstrate self-control will improve Ability to identify and develop effective coping behaviors will improve Ability to maintain clinical measurements within normal limits will improve Ability to identify changes in lifestyle to reduce recurrence of condition will improve Ability to verbalize feelings will improve Ability to disclose and discuss suicidal ideas Ability to demonstrate self-control will improve Ability to identify and develop effective coping behaviors will improve Ability to maintain clinical measurements within normal limits will improve  Medication Management: Evaluate patient's response, side effects,  and tolerance of medication regimen.  Therapeutic Interventions: 1 to 1 sessions, Unit Group sessions and Medication administration.  Evaluation of Outcomes: Progressing  Physician Treatment Plan for Secondary Diagnosis: Active Problems:   Schizophrenia ([place])  Long Term Goal(s): Improvement in symptoms so as ready for discharge Improvement in symptoms so as ready for discharge   Short Term Goals: Ability to identify changes in lifestyle to reduce recurrence of condition will improve Ability to verbalize feelings will improve Ability to disclose and discuss suicidal ideas Ability to demonstrate self-control will improve Ability to identify and develop effective coping behaviors will improve Ability to maintain clinical measurements within normal limits will improve Ability to identify changes in lifestyle to reduce recurrence of condition will improve Ability to verbalize feelings will improve Ability to disclose and discuss suicidal ideas Ability to demonstrate self-control will improve Ability to identify and develop effective coping behaviors will improve Ability to maintain clinical measurements within normal limits will improve     Medication Management: Evaluate patient's response, side effects, and tolerance of medication regimen.  Therapeutic Interventions:  1 to 1 sessions, Unit Group sessions and Medication administration.  Evaluation of Outcomes: Progressing   RN Treatment Plan for Primary Diagnosis: <principal problem not specified> Long Term Goal(s): Knowledge of disease and therapeutic regimen to maintain health will improve  Short Term Goals: Ability to identify and develop effective coping behaviors will improve and Compliance with prescribed medications will improve  Medication Management: RN will administer medications as ordered by provider, will assess and evaluate patient's response and provide education to patient for prescribed medication. RN will report any adverse and/or side effects to prescribing provider.  Therapeutic Interventions: 1 on 1 counseling sessions, Psychoeducation, Medication administration, Evaluate responses to treatment, Monitor vital signs and CBGs as ordered, Perform/monitor CIWA, COWS, AIMS and Fall Risk screenings as ordered, Perform wound care treatments as ordered.  Evaluation of Outcomes: Progressing   LCSW Treatment Plan for Primary Diagnosis: <principal problem not specified> Long Term Goal(s): Safe transition to appropriate next level of care at discharge, Engage patient in therapeutic group addressing interpersonal concerns.  Short Term Goals: Engage patient in aftercare planning with referrals and resources, Increase social support and Increase skills for wellness and recovery  Therapeutic Interventions: Assess for all discharge needs, 1 to 1 time with Social worker, Explore available resources and support systems, Assess for adequacy in community support network, Educate family and significant other(s) on suicide prevention, Complete Psychosocial Assessment, Interpersonal group therapy.  Evaluation of Outcomes: Progressing   Progress in Treatment: Attending groups: No. Participating in groups: No. Taking medication as prescribed: Yes. Toleration medication: Yes. Family/Significant other  contact made: No, will contact:  when given permission Patient understands diagnosis: Yes. Discussing patient identified problems/goals with staff: Yes. Medical problems stabilized or resolved: Yes. Denies suicidal/homicidal ideation: Yes. Issues/concerns per patient self-inventory: No. Other: none  New problem(s) identified: No, Describe:  none  New Short Term/Long Term Goal(s):  Patient Goals:  "Make sure I'm on the right meds, go home soon"  Discharge Plan or Barriers:   Reason for Continuation of Hospitalization: Depression Medication stabilization  Estimated Length of Stay: 1-2 days.  Attendees: Patient: Carol Vazquez 08/25/2018   Physician: Dr. Mallie Darting, MD 08/25/2018   Nursing: Grayland Ormond, RN 08/25/2018   RN Care Manager: 08/25/2018   Social Worker: Lurline Idol, LCSW 08/25/2018   Recreational Therapist:  08/25/2018   Other:  08/25/2018   Other:  08/25/2018   Other: 08/25/2018  Scribe for Treatment Team: Deirdre Evener 08/25/2018 2:20 PM

## 2018-08-25 NOTE — H&P (Signed)
Psychiatric Admission Assessment Adult  Patient Identification: Carol Vazquez MRN:  [id number] Date of Evaluation:  08/25/2018 Chief Complaint:  MDD Principal Diagnosis: <principal problem not specified> Diagnosis:  Active Problems:   Schizophrenia ([place])  History of Present Illness: Patient is seen and examined.  [name] with a past psychiatric history significant for schizophrenia who presented to the [facility] emergency department on 08/23/18 secondary to an unintentional overdose.  The patient stated at that time she got confused and "took a lot of medicine to make my leg feel better".  The patient has a longstanding history of schizophrenia, but has not been hospitalized in over 15 years.  She resides with her mother and mother's boyfriend.  On evaluation this morning she denied any auditory or visual hallucinations.  No evidence of paranoia.  No suicidal ideation.  She just got confused about her medications.  She stated she is spoken to her mother and from now on her mother is going to control her medications.  She was admitted to the hospital for evaluation and stabilization.  It appears that the medication she took her most likely psychiatric in origin.  Associated Signs/Symptoms: Depression Symptoms:  anhedonia, psychomotor retardation, fatigue, anxiety, loss of energy/fatigue, (Hypo) Manic Symptoms:  Hallucinations, Anxiety Symptoms:  Excessive Worry, Psychotic Symptoms:  Hallucinations: Auditory PTSD Symptoms: Negative Total Time spent with patient: 30 minutes  Past Psychiatric History: Patient has a longstanding history of schizophrenia.  She stated her last psychiatric hospitalization was over 15 years ago.  She has been on stable oral medications for quite a while.  Is the patient at risk to self? No.  Has the patient been a risk to self in the past 6 months? No.  Has the patient been a risk to self within the distant past? No.  Is the  patient a risk to others? No.  Has the patient been a risk to others in the past 6 months? No.  Has the patient been a risk to others within the distant past? Yes.     Prior Inpatient Therapy: Prior Inpatient Therapy: Yes Prior Therapy Dates: UNKNOWN Prior Therapy Facilty/Provider(s): [facility] Reason for Treatment: SCHIZOPHRENIA Prior Outpatient Therapy: Prior Outpatient Therapy: (UNKNOWN)  Alcohol Screening: 1. How often do you have a drink containing alcohol?: Never 2. How many drinks containing alcohol do you have on a typical day when you are drinking?: 1 or 2 3. How often do you have six or more drinks on one occasion?: Never AUDIT-C Score: 0 4. How often during the last year have you found that you were not able to stop drinking once you had started?: Never 5. How often during the last year have you failed to do what was normally expected from you becasue of drinking?: Never 6. How often during the last year have you needed a first drink in the morning to get yourself going after a heavy drinking session?: Never 7. How often during the last year have you had a feeling of guilt of remorse after drinking?: Never 8. How often during the last year have you been unable to remember what happened the night before because you had been drinking?: Never 9. Have you or someone else been injured as a result of your drinking?: No 10. Has a relative or friend or a doctor or another health worker been concerned about your drinking or suggested you cut down?: No Alcohol Use Disorder Identification Test Final Score (AUDIT): 0 Alcohol Brief Interventions/Follow-up: AUDIT Score <7 follow-up  not indicated Substance Abuse History in the last 12 months:  No. Consequences of Substance Abuse: Negative Previous Psychotropic Medications: Yes  Psychological Evaluations: Yes  Past Medical History:  Past Medical History:  Diagnosis Date  . Allergic rhinitis   . Anxiety   . Asthma   . Depression   . GERD  (gastroesophageal reflux disease)   . Tobacco abuse    History reviewed. No pertinent surgical history. Family History:  Family History  Problem Relation Age of Onset  . Asthma Mother   . Allergic rhinitis Mother    Family Psychiatric  History: Noncontributory Tobacco Screening: Have you used any form of tobacco in the last 30 days? (Cigarettes, Smokeless Tobacco, Cigars, and/or Pipes): Yes Tobacco use, Select all that apply: 5 or more cigarettes per day Are you interested in Tobacco Cessation Medications?: Yes, will notify MD for an order Counseled patient on smoking cessation including recognizing danger situations, developing coping skills and basic information about quitting provided: Yes Social History:  Social History   Substance and Sexual Activity  Alcohol Use No     Social History   Substance and Sexual Activity  Drug Use Never    Additional Social History: Marital status: Single    Pain Medications: See MAR Prescriptions: See MAR Over the Counter: See MAR History of alcohol / drug use?: No history of alcohol / drug abuse                    Allergies:   Allergies  Allergen Reactions  . Ivp Dye [Iodinated Diagnostic Agents] Shortness Of Breath  . Mobic [Meloxicam] Shortness Of Breath and Swelling  . Penicillins Diarrhea  . Seroquel [Quetiapine Fumarate]     Altered Mental Status   Lab Results:  Results for orders placed or performed during the hospital encounter of 08/24/18 (from the past 48 hour(s))  CBC     Status: Abnormal   Collection Time: 08/25/18  6:40 AM  Result Value Ref Range   WBC 15.3 (H) 4.0 - 10.5 K/uL   RBC 4.31 3.87 - 5.11 MIL/uL   Hemoglobin 14.0 12.0 - 15.0 g/dL   HCT 43.3 36.0 - 46.0 %   MCV 100.5 (H) 80.0 - 100.0 fL   MCH 32.5 26.0 - 34.0 pg   MCHC 32.3 30.0 - 36.0 g/dL   RDW 14.1 11.5 - 15.5 %   Platelets 414 (H) 150 - 400 K/uL   nRBC 0.0 0.0 - 0.2 %    Comment: Performed at [facility], [place] [address], [place], [place] [address]  Comprehensive metabolic panel     Status: Abnormal   Collection Time: 08/25/18  6:40 AM  Result Value Ref Range   Sodium 140 135 - 145 mmol/L   Potassium 4.5 3.5 - 5.1 mmol/L   Chloride 102 98 - 111 mmol/L   CO2 26 22 - 32 mmol/L   Glucose, Bld 110 (H) 70 - 99 mg/dL   BUN 8 6 - 20 mg/dL   Creatinine, Ser 0.72 0.44 - 1.00 mg/dL   Calcium 9.6 8.9 - 10.3 mg/dL   Total Protein 7.8 6.5 - 8.1 g/dL   Albumin 4.3 3.5 - 5.0 g/dL   AST 58 (H) 15 - 41 U/L   ALT 63 (H) 0 - 44 U/L   Alkaline Phosphatase 125 38 - 126 U/L   Total Bilirubin 0.7 0.3 - 1.2 mg/dL   GFR calc non Af Amer >60 >60 mL/min   GFR calc Af  Amer >60 >60 mL/min   Anion gap 12 5 - 15    Comment: Performed at [facility], [place] [address], [place], [place] [address]  Hemoglobin A1c     Status: Abnormal   Collection Time: 08/25/18  6:40 AM  Result Value Ref Range   Hgb A1c MFr Bld 6.0 (H) 4.8 - 5.6 %    Comment: (NOTE) Pre diabetes:          5.7%-6.4% Diabetes:              >6.4% Glycemic control for   <7.0% adults with diabetes    Mean Plasma Glucose 125.5 mg/dL    Comment: Performed at [facility] [address], [place], [place] [address]  Lipid panel     Status: Abnormal   Collection Time: 08/25/18  6:40 AM  Result Value Ref Range   Cholesterol 250 (H) 0 - 200 mg/dL   Triglycerides 317 (H) <150 mg/dL   HDL 36 (L) >40 mg/dL   Total CHOL/HDL Ratio 6.9 RATIO   VLDL 63 (H) 0 - 40 mg/dL   LDL Cholesterol 151 (H) 0 - 99 mg/dL    Comment:        Total Cholesterol/HDL:CHD Risk Coronary Heart Disease Risk Table                     Men   Women  1/2 Average Risk   3.4   3.3  Average Risk       5.0   4.4  2 X Average Risk   9.6   7.1  3 X Average Risk  23.4   11.0        Use the calculated Patient Ratio above and the CHD Risk Table to determine the patient's CHD Risk.        ATP III CLASSIFICATION (LDL):  <100     mg/dL   Optimal  100-129  mg/dL   Near or Above                     Optimal  130-159  mg/dL   Borderline  160-189  mg/dL   High  >190     mg/dL   Very High Performed at [facility] [address] [place], [place] [address]   TSH     Status: None   Collection Time: 08/25/18  6:40 AM  Result Value Ref Range   TSH 1.976 0.350 - 4.500 uIU/mL    Comment: Performed by a 3rd Generation assay with a functional sensitivity of <=0.01 uIU/mL. Performed at [facility], [place] [address], [place], [place] [address]     Blood Alcohol level:  No results found for: [facility]  Metabolic Disorder Labs:  Lab Results  Component Value Date   HGBA1C 6.0 (H) 08/25/2018   MPG 125.5 08/25/2018   No results found for: PROLACTIN Lab Results  Component Value Date   CHOL 250 (H) 08/25/2018   TRIG 317 (H) 08/25/2018   HDL 36 (L) 08/25/2018   CHOLHDL 6.9 08/25/2018   VLDL 63 (H) 08/25/2018   LDLCALC 151 (H) 08/25/2018    Current Medications: Current Facility-Administered Medications  Medication Dose Route Frequency Provider Last Rate Last Dose  . acetaminophen (TYLENOL) tablet 650 mg  650 mg Oral Q6H PRN Sharma Covert, MD      . albuterol (VENTOLIN HFA) 108 (90 Base) MCG/ACT inhaler 1-2 puff  1-2 puff Inhalation Q6H PRN  Sharma Covert, MD      . alum & mag hydroxide-simeth (MAALOX/MYLANTA) 200-200-20 MG/5ML suspension 30 mL  30 mL Oral Q4H PRN Sharma Covert, MD      . calcium carbonate (OS-CAL - dosed in mg of elemental calcium) tablet 1,250 mg  1,250 mg Oral Q breakfast Sharma Covert, MD   1,250 mg at 08/25/18 0758  . cyclobenzaprine (FLEXERIL) tablet 10 mg  10 mg Oral TID PRN Sharma Covert, MD   10 mg at 08/25/18 4401  . FLUoxetine (PROZAC) capsule 40 mg  40 mg Oral Daily Sharma Covert, MD   40 mg at 08/25/18 0757  . fluPHENAZine (PROLIXIN) tablet 5 mg  5 mg Oral QHS Sharma Covert, MD   5 mg at 08/24/18 2137  . hydrOXYzine (ATARAX/VISTARIL) tablet 25 mg  25 mg Oral TID PRN Sharma Covert, MD   25 mg at 08/24/18 2137  . loratadine (CLARITIN) tablet 10 mg  10 mg Oral Daily Sharma Covert, MD   10 mg at 08/25/18 0758  . magnesium hydroxide (MILK OF MAGNESIA) suspension 30 mL  30 mL Oral Daily PRN Sharma Covert, MD      . metFORMIN (GLUCOPHAGE) tablet 500 mg  500 mg Oral Q breakfast Sharma Covert, MD      . mometasone-formoterol [facility]) 200-5 MCG/ACT inhaler 2 puff  2 puff Inhalation BID Sharma Covert, MD   2 puff at 08/25/18 0802  . multivitamin (PROSIGHT) tablet 1 tablet  1 tablet Oral Daily Sharma Covert, MD   1 tablet at 08/25/18 0807  . naproxen (NAPROSYN) tablet 375 mg  375 mg Oral BID WC Sharma Covert, MD   375 mg at 08/25/18 0757  . nicotine (NICODERM CQ - dosed in mg/24 hours) patch 21 mg  21 mg Transdermal Daily Sharma Covert, MD   21 mg at 08/25/18 0803  . oxybutynin (DITROPAN) tablet 2.5 mg  2.5 mg Oral TID Sharma Covert, MD      . potassium chloride (K-DUR) CR tablet 10 mEq  10 mEq Oral Daily Sharma Covert, MD   10 mEq at 08/25/18 0801  . sulfamethoxazole-trimethoprim (BACTRIM) 400-80 MG per tablet 1 tablet  1 tablet Oral Q12H Sharma Covert, MD      . tiotropium [facility]) inhalation capsule [facility] use ONLY) 18 mcg  18 mcg Inhalation Daily Sharma Covert, MD   18 mcg at 08/25/18 0801  . traMADol (ULTRAM) tablet 50 mg  50 mg Oral Q12H PRN Sharma Covert, MD      . traZODone (DESYREL) tablet 50 mg  50 mg Oral QHS PRN Sharma Covert, MD      . ziprasidone (GEODON) capsule 40 mg  40 mg Oral BID WC Sharma Covert, MD   40 mg at 08/25/18 0802   PTA Medications: Medications Prior to Admission  Medication Sig Dispense Refill Last Dose  . albuterol (PROAIR HFA) 108 (90 Base) MCG/ACT inhaler USE 2 INHALATIONS EVERY 4 TO 6 HOURS AS NEEDED FOR COUGH OR WHEEZE 3 Inhaler 0 Past Week at Unknown time  . atorvastatin (LIPITOR) 10 MG tablet Take 10 mg by mouth.    Past Week at Unknown time  . calcium carbonate  (OS-CAL - DOSED IN MG OF ELEMENTAL CALCIUM) 1250 (500 Ca) MG tablet Take 1 tablet by mouth daily.   Past Week at Unknown time  . cetirizine (ZYRTEC) 10 MG tablet Can take one tablet  once daily if needed. 90 tablet 1 Past Week at Unknown time  . Collagen-Vitamin C (COLLAGEN PLUS VITAMIN C) 740-125 MG CAPS Take 1 tablet by mouth daily.   Past Week at Unknown time  . cyclobenzaprine (FLEXERIL) 10 MG tablet Take 1 tablet by mouth 3 (three) times daily as needed.   Past Week at Unknown time  . EPINEPHrine (EPIPEN 2-PAK) 0.3 mg/0.3 mL IJ SOAJ injection Use as directed for life-threatening allergic reaction. 2 Device 1 Past Month at Unknown time  . FLUoxetine (PROZAC) 20 MG capsule Take 40 mg by mouth daily.    Past Week at Unknown time  . fluPHENAZine (PROLIXIN) 5 MG tablet Take 1 tablet by mouth at bedtime.   Past Week at Unknown time  . fluticasone (FLONASE) 50 MCG/ACT nasal spray Use one spray in each nostril once daily. 48 g 1 Past Week at Unknown time  . fluticasone-salmeterol (ADVAIR HFA) 115-21 MCG/ACT inhaler Inhale two puffs into the lungs twice daily to prevent cough or wheeze.  Rinse, gargle, and spit after use. 3 Inhaler 1 Past Week at Unknown time  . montelukast (SINGULAIR) 10 MG tablet Take one tablet once daily 90 tablet 1 Past Week at Unknown time  . omeprazole (PRILOSEC) 40 MG capsule Take 1 capsule (40 mg total) by mouth 2 (two) times daily. 180 capsule 1 Past Week at Unknown time  . oxybutynin (DITROPAN XL) 15 MG 24 hr tablet Take 1 tablet by mouth daily.   Past Week at Unknown time  . traMADol (ULTRAM) 50 MG tablet 50 mg 2 (two) times daily.    Past Week at Unknown time  . ziprasidone (GEODON) 40 MG capsule Take 1 capsule by mouth 2 (two) times a day.   Past Week at Unknown time    Musculoskeletal: Strength & Muscle Tone: within normal limits Gait & Station: normal Patient leans: N/A  Psychiatric Specialty Exam: Physical Exam  Nursing note and vitals reviewed. Constitutional:  She is oriented to person, place, and time. She appears well-developed and well-nourished.  HENT:  Head: Normocephalic and atraumatic.  Respiratory: Effort normal.  Neurological: She is alert and oriented to person, place, and time.    ROS  Blood pressure (!) 144/99, pulse (!) 114, temperature 98.9 F (37.2 C), temperature source Oral, resp. rate 18, height 5' (1.524 m), weight 78.5 kg, last menstrual period 08/10/2018, SpO2 98 %.Body mass index is 33.79 kg/m.  General Appearance: Casual  Eye Contact:  Fair  Speech:  Normal Rate  Volume:  Normal  Mood:  Anxious  Affect:  Flat  Thought Process:  Coherent and Descriptions of Associations: Intact  Orientation:  Full (Time, Place, and Person)  Thought Content:  Logical  Suicidal Thoughts:  No  Homicidal Thoughts:  No  Memory:  Immediate;   Fair Recent;   Fair Remote;   Fair  Judgement:  Intact  Insight:  Fair  Psychomotor Activity:  Increased  Concentration:  Concentration: Fair and Attention Span: Fair  Recall:  [employer] of Knowledge:  Fair  Language:  Fair  Akathisia:  Negative  Handed:  Right  AIMS (if indicated):     Assets:  Desire for Improvement Housing Resilience Social Support  ADL's:  Intact  Cognition:  WNL  Sleep:  Number of Hours: 6.5    Treatment Plan Summary: Daily contact with patient to assess and evaluate symptoms and progress in treatment, Medication management and Plan : Patient is seen and examined.  [name] with  a past psychiatric history significant for schizophrenia.  She took an unintentional overdose of medication.  Does not appear as though there is been any problem or untoward reaction from that.  She denied suicidal ideation, she denied homicidal ideation, she denied auditory or visual hallucinations.  No evidence of gross paranoia.  She will be admitted to the hospital.  Should be integrated into the milieu.  She will be encouraged to attend groups.  We will contact her  family for collateral information.  If everything aligns up with what she is telling me today we will anticipate discharge most probably tomorrow.  None of her psychiatric medications will be changed.  She will be continued on her Lipitor, Allegra, omeprazole, oxybutynin, Spiriva and cyclobenzaprine.  She also has a prescription for tramadol and this will be continued on a as needed basis.  Review of her laboratories showed a white blood cell count that was elevated at 20.1.  Platelets are elevated at 408.  She was mildly hyponatremic at 130.  Urinalysis showed 1+ leukocyte Estrace positive with 5-10 white blood cells, but unfortunately was a contaminated sample with 2+ epithelial cells.  He did have few bacteria, so I will go on and start her on antibiotics, but we will repeat her urinalysis.  She is not allergic to any antibiotics, so I will start her on Bactrim 1 tab p.o. twice daily.  Her liver function enzymes are also significantly elevated with an AST of 105 and an ALT of 95.  Her alkaline phosphatase is elevated at 144.  Drug screen was completely negative.  Blood alcohol was negative.  We will repeat especially the liver function enzymes.  Her glucose in the emergency room was significantly elevated at 244 which may be contributing to her liver function enzyme abnormalities.  Her hemoglobin A1c is 6.0 so she is diabetic.  I will start her on metformin 500 mg p.o. daily.  And get Accu-Cheks on a daily basis to assess her blood sugar.  As well.  Observation Level/Precautions:  15 minute checks  Laboratory:  UA  Psychotherapy:    Medications:    Consultations:    Discharge Concerns:    Estimated LOS:  Other:     Physician Treatment Plan for Primary Diagnosis: <principal problem not specified> Long Term Goal(s): Improvement in symptoms so as ready for discharge  Short Term Goals: Ability to identify changes in lifestyle to reduce recurrence of condition will improve, Ability to verbalize feelings  will improve, Ability to disclose and discuss suicidal ideas, Ability to demonstrate self-control will improve, Ability to identify and develop effective coping behaviors will improve and Ability to maintain clinical measurements within normal limits will improve  Physician Treatment Plan for Secondary Diagnosis: Active Problems:   Schizophrenia ([place])  Long Term Goal(s): Improvement in symptoms so as ready for discharge  Short Term Goals: Ability to identify changes in lifestyle to reduce recurrence of condition will improve, Ability to verbalize feelings will improve, Ability to disclose and discuss suicidal ideas, Ability to demonstrate self-control will improve, Ability to identify and develop effective coping behaviors will improve and Ability to maintain clinical measurements within normal limits will improve  I certify that inpatient services furnished can reasonably be expected to improve the patient's condition.    Sharma Covert, MD 5/11/202011:25 AM

## 2018-08-26 LAB — CBC WITH DIFFERENTIAL/PLATELET
Abs Immature Granulocytes: 0.06 10*3/uL (ref 0.00–0.07)
Basophils Absolute: 0 10*3/uL (ref 0.0–0.1)
Basophils Relative: 0 %
Eosinophils Absolute: 0.2 10*3/uL (ref 0.0–0.5)
Eosinophils Relative: 1 %
HCT: 43.8 % (ref 36.0–46.0)
Hemoglobin: 13.9 g/dL (ref 12.0–15.0)
Immature Granulocytes: 0 %
Lymphocytes Relative: 20 %
Lymphs Abs: 2.9 10*3/uL (ref 0.7–4.0)
MCH: 31.8 pg (ref 26.0–34.0)
MCHC: 31.7 g/dL (ref 30.0–36.0)
MCV: 100.2 fL — ABNORMAL HIGH (ref 80.0–100.0)
Monocytes Absolute: 0.8 10*3/uL (ref 0.1–1.0)
Monocytes Relative: 6 %
Neutro Abs: 10.8 10*3/uL — ABNORMAL HIGH (ref 1.7–7.7)
Neutrophils Relative %: 73 %
Platelets: 408 10*3/uL — ABNORMAL HIGH (ref 150–400)
RBC: 4.37 MIL/uL (ref 3.87–5.11)
RDW: 14 % (ref 11.5–15.5)
WBC: 14.7 10*3/uL — ABNORMAL HIGH (ref 4.0–10.5)
nRBC: 0 % (ref 0.0–0.2)

## 2018-08-26 LAB — PROLACTIN: Prolactin: 35.5 ng/mL — ABNORMAL HIGH (ref 4.8–23.3)

## 2018-08-26 LAB — GLUCOSE, CAPILLARY: Glucose-Capillary: 108 mg/dL — ABNORMAL HIGH (ref 70–99)

## 2018-08-26 NOTE — Progress Notes (Signed)
[facility] MD Progress Note  08/26/2018 12:57 PM Carol Vazquez  MRN:  [id number] Subjective: Patient reports "I am feeling okay".  Requesting that team/provider speak with her mother.  Currently denying medication side effects.  Denies suicidal ideations. Objective: Have discussed case with treatment team and have met with patient.  43 year old female, history of chronic mental illness (has been diagnosed with schizophrenia in the past).  Presented to [facility] ED on 5/9 following an overdose on prescribed medication. Patient is unsure which specific medication she took .States " I was just trying to make my legs feel better".  Patient reports that this was accidental and denies any suicidal or self-injurious plan or intent.  At this time denies suicidal ideations, presents future oriented, hopeful for discharge soon. With her expressed consent I have spoken with her mother with whom she lives.  Mother corroborates that patient is currently stable and at baseline and is in agreement with discharge planning.  Of note, patient had an initial elevated white blood count ( 20.1 on 5/9)and was noted to have elevated AST and ALT ( 105,95 on 5/9).  Dr. Mallie Darting, who admitted patient, ordered an abdominal ultrasound, which was done yesterday evening.  It is reported as unremarkable gallbladder, coarsened appearance of liver parenchyma reflecting steatosis or other liver disease.  5/11 Labs reviewed-AST improved to 58, ALT improved to 63, WBC improved to 15.3 . TSH 1.976, HgbA1C 6.0 Behavior on unit in good control, somewhat anxious/needing gentle reassurance from staff.  Focused on being discharged home soon.  Denying any suicidal ideations or self-injurious ideations.  Of note, no current presentation of psychosis.  No thought disorder noted, does not appear internally preoccupied, no delusions expressed, denies hallucinations.  She was continued on her home medications which include Prozac, Prolixin, Geodon.   States she tolerates these medications well, denies side effects, feels they are helpful.   Principal Problem: S/P Overdose  Diagnosis: Active Problems:   Schizophrenia ([place])  Total Time spent with patient: 20 minutes  Past Psychiatric History:  Past Medical History:  Past Medical History:  Diagnosis Date  . Allergic rhinitis   . Anxiety   . Asthma   . Depression   . GERD (gastroesophageal reflux disease)   . Tobacco abuse    History reviewed. No pertinent surgical history. Family History:  Family History  Problem Relation Age of Onset  . Asthma Mother   . Allergic rhinitis Mother    Family Psychiatric  History:  Social History:  Social History   Substance and Sexual Activity  Alcohol Use No     Social History   Substance and Sexual Activity  Drug Use Never    Social History   Socioeconomic History  . Marital status: Single    Spouse name: Not on file  . Number of children: Not on file  . Years of education: Not on file  . Highest education level: Not on file  Occupational History  . Not on file  Social Needs  . Financial resource strain: Not on file  . Food insecurity:    Worry: Not on file    Inability: Not on file  . Transportation needs:    Medical: Not on file    Non-medical: Not on file  Tobacco Use  . Smoking status: Current Every Day Smoker    Packs/day: 1.00    Years: 24.00    Pack years: 24.00    Types: Cigarettes  . Smokeless tobacco: Never Used  Substance and Sexual Activity  .  Alcohol use: No  . Drug use: Never  . Sexual activity: Not Currently    Birth control/protection: None  Lifestyle  . Physical activity:    Days per week: Not on file    Minutes per session: Not on file  . Stress: Not on file  Relationships  . Social connections:    Talks on phone: Not on file    Gets together: Not on file    Attends religious service: Not on file    Active member of club or organization: Not on file    Attends meetings of clubs or  organizations: Not on file    Relationship status: Not on file  Other Topics Concern  . Not on file  Social History Narrative  . Not on file   Additional Social History:    Pain Medications: See MAR Prescriptions: See MAR Over the Counter: See MAR History of alcohol / drug use?: No history of alcohol / drug abuse  Sleep: Fair  Appetite:  Good  Current Medications: Current Facility-Administered Medications  Medication Dose Route Frequency Provider Last Rate Last Dose  . acetaminophen (TYLENOL) tablet 650 mg  650 mg Oral Q6H PRN Sharma Covert, MD      . albuterol (VENTOLIN HFA) 108 (90 Base) MCG/ACT inhaler 1-2 puff  1-2 puff Inhalation Q6H PRN Sharma Covert, MD      . alum & mag hydroxide-simeth (MAALOX/MYLANTA) 200-200-20 MG/5ML suspension 30 mL  30 mL Oral Q4H PRN Sharma Covert, MD      . calcium carbonate (OS-CAL - dosed in mg of elemental calcium) tablet 1,250 mg  1,250 mg Oral Q breakfast Sharma Covert, MD   1,250 mg at 08/26/18 0800  . cyclobenzaprine (FLEXERIL) tablet 10 mg  10 mg Oral TID PRN Sharma Covert, MD   10 mg at 08/26/18 0801  . FLUoxetine (PROZAC) capsule 40 mg  40 mg Oral Daily Sharma Covert, MD   40 mg at 08/26/18 0759  . fluPHENAZine (PROLIXIN) tablet 5 mg  5 mg Oral QHS Sharma Covert, MD   5 mg at 08/25/18 2047  . hydrOXYzine (ATARAX/VISTARIL) tablet 25 mg  25 mg Oral TID PRN Sharma Covert, MD   25 mg at 08/24/18 2137  . loratadine (CLARITIN) tablet 10 mg  10 mg Oral Daily Sharma Covert, MD   10 mg at 08/26/18 0801  . magnesium hydroxide (MILK OF MAGNESIA) suspension 30 mL  30 mL Oral Daily PRN Sharma Covert, MD      . metFORMIN (GLUCOPHAGE) tablet 500 mg  500 mg Oral Q breakfast Sharma Covert, MD   500 mg at 08/26/18 0801  . mometasone-formoterol (DULERA) 200-5 MCG/ACT inhaler 2 puff  2 puff Inhalation BID Sharma Covert, MD   2 puff at 08/26/18 0756  . multivitamin (PROSIGHT) tablet 1 tablet  1 tablet  Oral Daily Sharma Covert, MD   1 tablet at 08/26/18 0759  . naproxen (NAPROSYN) tablet 375 mg  375 mg Oral BID WC Sharma Covert, MD   375 mg at 08/26/18 0759  . nicotine (NICODERM CQ - dosed in mg/24 hours) patch 21 mg  21 mg Transdermal Daily Sharma Covert, MD   21 mg at 08/26/18 0802  . oxybutynin (DITROPAN) tablet 2.5 mg  2.5 mg Oral TID Sharma Covert, MD   2.5 mg at 08/26/18 1222  . potassium chloride (K-DUR) CR tablet 10 mEq  10 mEq Oral Daily Myles Lipps  Kellie Simmering, MD   10 mEq at 08/26/18 0800  . sulfamethoxazole-trimethoprim (BACTRIM) 400-80 MG per tablet 1 tablet  1 tablet Oral Q12H Sharma Covert, MD   1 tablet at 08/26/18 0801  . tiotropium (SPIRIVA) inhalation capsule ([facility] use ONLY) 18 mcg  18 mcg Inhalation Daily Sharma Covert, MD   18 mcg at 08/26/18 0758  . traMADol (ULTRAM) tablet 50 mg  50 mg Oral Q12H PRN Sharma Covert, MD      . traZODone (DESYREL) tablet 50 mg  50 mg Oral QHS PRN Sharma Covert, MD   50 mg at 08/25/18 2339  . ziprasidone (GEODON) capsule 40 mg  40 mg Oral BID WC Sharma Covert, MD   40 mg at 08/26/18 0801    Lab Results:  Results for orders placed or performed during the hospital encounter of 08/24/18 (from the past 48 hour(s))  CBC     Status: Abnormal   Collection Time: 08/25/18  6:40 AM  Result Value Ref Range   WBC 15.3 (H) 4.0 - 10.5 K/uL   RBC 4.31 3.87 - 5.11 MIL/uL   Hemoglobin 14.0 12.0 - 15.0 g/dL   HCT 43.3 36.0 - 46.0 %   MCV 100.5 (H) 80.0 - 100.0 fL   MCH 32.5 26.0 - 34.0 pg   MCHC 32.3 30.0 - 36.0 g/dL   RDW 14.1 11.5 - 15.5 %   Platelets 414 (H) 150 - 400 K/uL   nRBC 0.0 0.0 - 0.2 %    Comment: Performed at [facility], [place] [address], [place], [place] [address]  Comprehensive metabolic panel     Status: Abnormal   Collection Time: 08/25/18  6:40 AM  Result Value Ref Range   Sodium 140 135 - 145 mmol/L   Potassium 4.5 3.5 - 5.1 mmol/L   Chloride 102 98 - 111 mmol/L   CO2  26 22 - 32 mmol/L   Glucose, Bld 110 (H) 70 - 99 mg/dL   BUN 8 6 - 20 mg/dL   Creatinine, Ser 0.72 0.44 - 1.00 mg/dL   Calcium 9.6 8.9 - 10.3 mg/dL   Total Protein 7.8 6.5 - 8.1 g/dL   Albumin 4.3 3.5 - 5.0 g/dL   AST 58 (H) 15 - 41 U/L   ALT 63 (H) 0 - 44 U/L   Alkaline Phosphatase 125 38 - 126 U/L   Total Bilirubin 0.7 0.3 - 1.2 mg/dL   GFR calc non Af Amer >60 >60 mL/min   GFR calc Af Amer >60 >60 mL/min   Anion gap 12 5 - 15    Comment: Performed at [facility], [place] [address] [place], [place] [address]  Hemoglobin A1c     Status: Abnormal   Collection Time: 08/25/18  6:40 AM  Result Value Ref Range   Hgb A1c MFr Bld 6.0 (H) 4.8 - 5.6 %    Comment: (NOTE) Pre diabetes:          5.7%-6.4% Diabetes:              >6.4% Glycemic control for   <7.0% adults with diabetes    Mean Plasma Glucose 125.5 mg/dL    Comment: Performed at [facility] [address] [place], [place] [address]  Lipid panel     Status: Abnormal   Collection Time: 08/25/18  6:40 AM  Result Value Ref Range   Cholesterol 250 (H) 0 - 200 mg/dL   Triglycerides 317 (H) <150  mg/dL   HDL 36 (L) >40 mg/dL   Total CHOL/HDL Ratio 6.9 RATIO   VLDL 63 (H) 0 - 40 mg/dL   LDL Cholesterol 151 (H) 0 - 99 mg/dL    Comment:        Total Cholesterol/HDL:CHD Risk Coronary Heart Disease Risk Table                     Men   Women  1/2 Average Risk   3.4   3.3  Average Risk       5.0   4.4  2 X Average Risk   9.6   7.1  3 X Average Risk  23.4   11.0        Use the calculated Patient Ratio above and the CHD Risk Table to determine the patient's CHD Risk.        ATP III CLASSIFICATION (LDL):  <100     mg/dL   Optimal  100-129  mg/dL   Near or Above                    Optimal  130-159  mg/dL   Borderline  160-189  mg/dL   High  >190     mg/dL   Very High Performed at [facility] [address], [place], [place] [address]   TSH     Status: None   Collection Time:  08/25/18  6:40 AM  Result Value Ref Range   TSH 1.976 0.350 - 4.500 uIU/mL    Comment: Performed by a 3rd Generation assay with a functional sensitivity of <=0.01 uIU/mL. Performed at [facility], [place] [address], [place], [place] [address]   Prolactin     Status: Abnormal   Collection Time: 08/25/18  6:40 AM  Result Value Ref Range   Prolactin 35.5 (H) 4.8 - 23.3 ng/mL    Comment: (NOTE) Performed At: [facility] [place], [place] [id number] Rush Farmer MD QQ:[id number]   Urinalysis, Complete w Microscopic     Status: Abnormal   Collection Time: 08/25/18 11:20 AM  Result Value Ref Range   Color, Urine YELLOW YELLOW   APPearance HAZY (A) CLEAR   Specific Gravity, Urine 1.015 1.005 - 1.030   pH 6.0 5.0 - 8.0   Glucose, UA NEGATIVE NEGATIVE mg/dL   Hgb urine dipstick SMALL (A) NEGATIVE   Bilirubin Urine NEGATIVE NEGATIVE   Ketones, ur NEGATIVE NEGATIVE mg/dL   Protein, ur NEGATIVE NEGATIVE mg/dL   Nitrite NEGATIVE NEGATIVE   Leukocytes,Ua SMALL (A) NEGATIVE   RBC / HPF 6-10 0 - 5 RBC/hpf   WBC, UA 6-10 0 - 5 WBC/hpf   Bacteria, UA RARE (A) NONE SEEN   Squamous Epithelial / LPF 0-5 0 - 5   Mucus PRESENT    Ca Oxalate Crys, UA PRESENT     Comment: Performed at [facility], [place] [address] [place], [place] [address]  Glucose, capillary     Status: Abnormal   Collection Time: 08/25/18 12:39 PM  Result Value Ref Range   Glucose-Capillary 130 (H) 70 - 99 mg/dL  Glucose, capillary     Status: None   Collection Time: 08/25/18  5:04 PM  Result Value Ref Range   Glucose-Capillary 95 70 - 99 mg/dL  Glucose, capillary     Status: Abnormal   Collection Time: 08/26/18  6:05 AM  Result Value Ref Range   Glucose-Capillary 108 (H) 70 - 99  mg/dL   Comment 1 Notify RN    Comment 2 Document in Chart     Blood Alcohol level:  No results found for: [facility]  Metabolic Disorder Labs: Lab Results  Component Value Date    HGBA1C 6.0 (H) 08/25/2018   MPG 125.5 08/25/2018   Lab Results  Component Value Date   PROLACTIN 35.5 (H) 08/25/2018   Lab Results  Component Value Date   CHOL 250 (H) 08/25/2018   TRIG 317 (H) 08/25/2018   HDL 36 (L) 08/25/2018   CHOLHDL 6.9 08/25/2018   VLDL 63 (H) 08/25/2018   LDLCALC 151 (H) 08/25/2018    Physical Findings: AIMS: Facial and Oral Movements Muscles of Facial Expression: None, normal Lips and Perioral Area: None, normal Jaw: None, normal Tongue: None, normal,Extremity Movements Upper (arms, wrists, hands, fingers): None, normal Lower (legs, knees, ankles, toes): None, normal, Trunk Movements Neck, shoulders, hips: None, normal, Overall Severity Severity of abnormal movements (highest score from questions above): None, normal Incapacitation due to abnormal movements: None, normal Patient's awareness of abnormal movements (rate only patient's report): No Awareness, Dental Status Current problems with teeth and/or dentures?: No Does patient usually wear dentures?: No  CIWA:  CIWA-Ar Total: 0 COWS:  COWS Total Score: 1  Musculoskeletal: Strength & Muscle Tone: within normal limits Gait & Station: normal Patient leans: N/A  Psychiatric Specialty Exam: Physical Exam  ROS denies chest pain or shortness of breath, no cough, no fever.  Denies abdominal pain or discomfort today, denies acholia or choluria  Blood pressure 129/74, pulse (!) 103, temperature 98.4 F (36.9 C), temperature source Oral, resp. rate 16, height 5' (1.524 m), weight 78.5 kg, last menstrual period 08/10/2018, SpO2 94 %.Body mass index is 33.79 kg/m.  General Appearance: Fairly Groomed  Eye Contact:  Good  Speech:  Normal Rate  Volume:  Decreased  Mood:  Reports her mood is "okay".  Denies depression at this time.  Presents vaguely anxious.  Affect:  Congruent and Anxious  Thought Process:  Linear and Descriptions of Associations: Circumstantial  Orientation:  Other:  Fully alert and  attentive  Thought Content:  No hallucinations, no delusions, not internally preoccupied  Suicidal Thoughts:  No denies suicidal or self-injurious ideations, denies homicidal or violent ideations  Homicidal Thoughts:  No  Memory:  Recent and remote grossly intact  Judgement:  Fair-improving  Insight:  Fair  Psychomotor Activity:  Normal  Concentration:  Concentration: Good and Attention Span: Good  Recall:  Good  Fund of Knowledge:  Good  Language:  Good  Akathisia:  Negative  Handed:  Right  AIMS (if indicated):     Assets:  Communication Skills Desire for Improvement Resilience  ADL's:  Intact  Cognition:  WNL  Sleep:  Number of Hours: 3   Assessment -   43 year old female, lives with mother, history of Schizophrenia. Presented following medication overdose ( 5/9) , which she states was accidental and not suicidal or self injurious in intent. It is unclear which specific medication she overdosed on Currently minimizes depression, denies suicidal ideations, and is future oriented, hoping to return home soon. Mother , whom I spoke with via phone at patient's request, corroborates patient is currently at baseline and agrees with discharge planning . LFTs and WBC initially elevated but have trended down , patient is not febrile and currently denies physical symptoms and does not appear to be in any acute distress or discomfort . Abdominal Ultrasound does reveal liver parenchyma appears coarsened .  Treatment Plan Summary: Daily contact with patient to assess and evaluate symptoms and progress in treatment, Medication management, Plan inpatient treatment and medications as below Encourage group and milieu participation to work on coping skills and symptom reduction Continue Glucophage 500 mg daily for glucose control Continue Geodon 40 mg twice daily for schizophrenia history Continue Prozac 40 mg daily for depression/anxiety Continue Prolixin 5 mg nightly for schizophrenia history Will  check EKG to monitor QTc .  Repeat CBC in AM. Check Hep C and Hep B serologies   Treatment team working on disposition planning options. Patient encouraged to follow up with PCP for further monitoring and management of elevated LFTs /hepatic findings . Jenne Campus, MD 08/26/2018, 12:57 PM

## 2018-08-26 NOTE — Progress Notes (Addendum)
spiritual care group on grief and loss facilitated by chaplain Jerene Pitch   Group Goal:  Support / Education around grief and loss Members engage in facilitated group support and psycho social education.   Group Description:  Following introductions and group rules,  Group members engaged in facilitated group dialog and support around topic of loss, with particular support around experiences of loss in their lives. Group Identified types of loss (relationships / self / things) and identified patterns, circumstances, and changes that precipitate losses. Reflected on thoughts / feelings around loss, normalized grief responses, and recognized variety in grief experience.  Patient Progress:    Did not attend group.

## 2018-08-26 NOTE — Progress Notes (Signed)
Psychoeducational Group Note  Date:  08/26/2018 Time:  2111  Group Topic/Focus:  Wrap-Up Group:   The focus of this group is to help patients review their daily goal of treatment and discuss progress on daily workbooks.  Participation Level: Did Not Attend  Participation Quality:  Not Applicable  Affect:  Not Applicable  Cognitive:  Not Applicable  Insight:  Not Applicable  Engagement in Group: Not Applicable  Additional Comments:  The patient did not attend group since she politely declined to participate.   [place] 08/26/2018, 9:11 PM

## 2018-08-26 NOTE — Progress Notes (Addendum)
Pt arrived back on unit from ultrasound with no issues. Pt able to eat her dinner, and given hs meds. Pt affect anxious,childlike, mood depressed. Pt visible in milieu. 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 Pt denies SI/HI or hallucinations (a) 15 min checks (r) safety maintained.

## 2018-08-26 NOTE — Progress Notes (Signed)
Pt presents with an anxious mood. Pt acts child-like and requires additional attention and assistance from staff. Pt denies depression and anxiety. Pt denies SI/HI. Pt expressed readiness for discharge home today. No concerns verbalized by pt.   Medications reviewed with pt. Verbal support provided. Pt encouraged to attend groups. 15 minute checks performed for safety.  Pt compliant with tx plan.

## 2018-08-26 NOTE — Progress Notes (Signed)
[facility] NOVEL CORONAVIRUS (COVID-19) DAILY CHECK-OFF SYMPTOMS - answer yes or no to each - every day NO YES  Have you had a fever in the past 24 hours?  . Fever (Temp > 37.80C / 100F) X   Have you had any of these symptoms in the past 24 hours? . New Cough .  Sore Throat  .  Shortness of Breath .  Difficulty Breathing .  Unexplained Body Aches   X   Have you had any one of these symptoms in the past 24 hours not related to allergies?   . Runny Nose .  Nasal Congestion .  Sneezing   X   If you have had runny nose, nasal congestion, sneezing in the past 24 hours, has it worsened?  X   EXPOSURES - check yes or no X   Have you traveled outside the state in the past 14 days?  X   Have you been in contact with someone with a confirmed diagnosis of COVID-19 or PUI in the past 14 days without wearing appropriate PPE?  X   Have you been living in the same home as a person with confirmed diagnosis of COVID-19 or a PUI (household contact)?    X   Have you been diagnosed with COVID-19?    X              What to do next: Answered NO to all: Answered YES to anything:   Proceed with unit schedule Follow the [facility] Inpatient Flowsheet.   

## 2018-08-26 NOTE — Progress Notes (Signed)
Pt has been having a hard time staying asleep tonight. Pt was observed eating cookies in her room at one point, and then comes out of room asking if she can be picked up at 9am tomorrow. Pt currently laying back down in bed. Safety maintained.

## 2018-08-26 NOTE — Plan of Care (Signed)
D: Patient is alert, pleasant, and cooperative. Denies SI, HI, AVH, and verbally contracts for safety. Patient reports she isn't going to groups because she was told she doesn't have to. Patient denies physical symptoms/pain. Patient interaction is childlike.   A: Medications administered per MD order. Support provided. Patient educated on safety on the unit and medications. Routine safety checks every 15 minutes. Patient stated understanding to tell nurse about any new physical symptoms. Patient understands to tell staff of any needs.     R: No adverse drug reactions noted. Patient verbally contracts for safety. Patient remains safe at this time and will continue to monitor.   Problem: Education: Goal: Mental status will improve Outcome: Progressing  Patient denies SI, HI, AVH, and contracts for safety. Patient remains safe and will continue to monitor.   [place] NOVEL CORONAVIRUS (COVID-19) DAILY CHECK-OFF SYMPTOMS - answer yes or no to each - every day NO YES  Have you had a fever in the past 24 hours?  Fever (Temp > 37.80C / 100F) X   Have you had any of these symptoms in the past 24 hours? New Cough  Sore Throat   Shortness of Breath  Difficulty Breathing  Unexplained Body Aches   X   Have you had any one of these symptoms in the past 24 hours not related to allergies?   Runny Nose  Nasal Congestion  Sneezing   X   If you have had runny nose, nasal congestion, sneezing in the past 24 hours, has it worsened?  X   EXPOSURES - check yes or no X   Have you traveled outside the state in the past 14 days?  X   Have you been in contact with someone with a confirmed diagnosis of COVID-19 or PUI in the past 14 days without wearing appropriate PPE?  X   Have you been living in the same home as a person with confirmed diagnosis of COVID-19 or a PUI (household contact)?    X   Have you been diagnosed with COVID-19?    X              What to do next: Answered NO to all: Answered  YES to anything:   Proceed with unit schedule Follow the [facility] Inpatient Flowsheet.

## 2018-08-26 NOTE — Progress Notes (Signed)
CSW attempted to reach patient's mother, Carol Vazquez ([phone]), twice to update her of discharge scheduled for tomorrow. The phone was answered and hung up twice.  Stephanie Acre, LCSW-A Clinical Social Worker

## 2018-08-27 LAB — GLUCOSE, CAPILLARY: Glucose-Capillary: 124 mg/dL — ABNORMAL HIGH (ref 70–99)

## 2018-08-27 MED ORDER — PROSIGHT PO TABS: 1.0000 | ORAL_TABLET | Freq: Every day | ORAL | 0 refills | Status: AC

## 2018-08-27 MED ORDER — ZIPRASIDONE HCL 40 MG PO CAPS: 40.0000 mg | ORAL_CAPSULE | Freq: Two times a day (BID) | ORAL | 0 refills | Status: AC

## 2018-08-27 MED ORDER — NICOTINE 21 MG/24HR TD PT24: 21.0000 mg | MEDICATED_PATCH | Freq: Every day | TRANSDERMAL | 0 refills | Status: DC

## 2018-08-27 MED ORDER — FLUOXETINE HCL 20 MG PO CAPS: 40.0000 mg | ORAL_CAPSULE | Freq: Every day | ORAL | 0 refills | Status: AC

## 2018-08-27 MED ORDER — FLUPHENAZINE HCL 5 MG PO TABS: 5.0000 mg | ORAL_TABLET | Freq: Every day | ORAL | 0 refills | Status: AC

## 2018-08-27 MED ORDER — METFORMIN HCL 500 MG PO TABS: 500.0000 mg | ORAL_TABLET | Freq: Every day | ORAL | 0 refills | Status: DC

## 2018-08-27 MED ORDER — SULFAMETHOXAZOLE-TRIMETHOPRIM 400-80 MG PO TABS: 1.0000 | ORAL_TABLET | Freq: Two times a day (BID) | ORAL | 0 refills | Status: DC

## 2018-08-27 NOTE — [facility] Suicide Risk Assessment (Signed)
[facility] Discharge Suicide Risk Assessment   Principal Problem: S/P Overdose Discharge Diagnoses: Active Problems:   Schizophrenia ([place])   Total Time spent with patient: 30 minutes  Musculoskeletal: Strength & Muscle Tone: within normal limits Gait & Station: normal Patient leans: N/A  Psychiatric Specialty Exam: ROS no headache, no chest pain, no shortness of breath, no fever, no chills, no acholia , no choluria ,no abdominal or RUQ pain  Blood pressure 132/88, pulse (!) 108, temperature 98.4 F (36.9 C), temperature source Oral, resp. rate 16, height 5' (1.524 m), weight 78.5 kg, last menstrual period 08/10/2018, SpO2 94 %.Body mass index is 33.79 kg/m.  General Appearance: Well Groomed  Eye Contact::  Good  Speech:  Normal Rate409  Volume:  Normal  Mood:  reports " my mood is good"  Affect:  Appropriate and reactive,less anxious   Thought Process:  Linear and Descriptions of Associations: Intact  Orientation:  Other:  fully alert and attentive  Thought Content:  no hallucinations, no delusions, not internally preoccupied   Suicidal Thoughts:  No denies any suicidal or self injurious ideations, denies homicidal or violent ideations  Homicidal Thoughts:  No  Memory:  recent and remote grossly intact   Judgement:  Other:  fair- improving   Insight:  fair- improving   Psychomotor Activity:  Normal  Concentration:  Good  Recall:  Good  Fund of Knowledge:Good  Language: Good  Akathisia:  Negative  Handed:  Right  AIMS (if indicated):     Assets:  Communication Skills Desire for Improvement Resilience  Sleep:  Number of Hours: 6.25  Cognition: WNL  ADL's:  Intact   Mental Status Per Nursing Assessment::   On Admission:  Suicidal ideation indicated by patient  Demographic Factors:  49, single, no children, lives with mother  Loss Factors: S/P overdose , which she states was accidental  Historical Factors: Has been diagnosed with Schizophrenia in the past   Risk  Reduction Factors:   Living with another person, especially a relative and Positive social support  Continued Clinical Symptoms:  At this time patient is alert, attentive, good eye contact, states she is feeling " OK", denies depression, affect less anxious today, smiles at times appropriately, no thought disorder, no suicidal or self injurious ideations, no homicidal or violent ideations, no hallucinations, no delusions, future oriented . Denies medication side effects. Visible on unit, behavior calm .  Cognitive Features That Contribute To Risk:  No gross cognitive deficits noted upon discharge. Is alert , attentive, and oriented x 3   Suicide Risk:  Mild:  Suicidal ideation of limited frequency, intensity, duration, and specificity.  There are no identifiable plans, no associated intent, mild dysphoria and related symptoms, good self-control (both objective and subjective assessment), few other risk factors, and identifiable protective factors, including available and accessible social support.  [place], Best boy. Go on 08/28/2018.   Why:  You will receive a call from [facility] [place] on Thursday, May 14th, 2020. Someone will call you from [facility] and do a hospital discharge appt and do financial paperwork.  Contact information: [place] [address] [phone]           Plan Of Care/Follow-up recommendations:  Activity:  as tolerated  Diet:  heart healthy Tests:  NA Other:  See below Patient is expressing readiness for discharge and is leaving unit in good spirits, plans to return home, states mother is picking her up later today. Plans to  follow up as above . * Patient expresses understanding regarding liver ultrasound findings and recent labs showing LFT elevation ( have trended down). She states she plans to follow up with her PCP for further management / monitoring . Has an established PCP, Dr. Venetia Maxon in [place].  Jenne Campus, MD 08/27/2018, 8:31 AM

## 2018-08-27 NOTE — Progress Notes (Addendum)
Patient ID: Carol Vazquez, female   DOB: 02-20-1976, 43 y.o.   MRN: [id number]  Discharge Note  Patient denies SI/HI and states readiness for discharge.  Written and verbal discharge instructions reviewed with the patient. Patient accepting to information and verbalized understanding with no concerns. All belongings returned to patient from the unit and secured lockers. Patient has completed their Suicide Safety Plan and has been provided Suicide Prevention resources. Patient provided an opportunity to complete and return Patient Satisfaction Survey.   Patient was safely escorted to the lobby for discharge and was picked up by her mother. Patient discharged from [facility] with prescriptions sent to pharmacy, personal belongings, follow-up appointment in place and discharge paperwork.

## 2018-08-27 NOTE — Discharge Summary (Addendum)
Physician Discharge Summary Note  Patient:  Carol Vazquez is an 43 y.o., female MRN:  [id number] DOB:  1975-11-12 Patient phone:  [phone] (home)  Patient address:   [place] [address],  Total Time spent with patient: 15 minutes  Date of Admission:  08/24/2018 Date of Discharge: 08/27/18  Reason for Admission:  Unintentional overdose  Principal Problem: Schizophrenia [facility]) Discharge Diagnoses: Principal Problem:   Schizophrenia [facility])   Past Psychiatric History: Per admission H&P: Patient has a longstanding history of schizophrenia.  She stated her last psychiatric hospitalization was over 15 years ago.  She has been on stable oral medications for quite a while.  Past Medical History:  Past Medical History:  Diagnosis Date  . Allergic rhinitis   . Anxiety   . Asthma   . Depression   . GERD (gastroesophageal reflux disease)   . Tobacco abuse    History reviewed. No pertinent surgical history. Family History:  Family History  Problem Relation Age of Onset  . Asthma Mother   . Allergic rhinitis Mother    Family Psychiatric  History: Denies Social History:  Social History   Substance and Sexual Activity  Alcohol Use No     Social History   Substance and Sexual Activity  Drug Use Never    Social History   Socioeconomic History  . Marital status: Single    Spouse name: Not on file  . Number of children: Not on file  . Years of education: Not on file  . Highest education level: Not on file  Occupational History  . Not on file  Social Needs  . Financial resource strain: Not on file  . Food insecurity:    Worry: Not on file    Inability: Not on file  . Transportation needs:    Medical: Not on file    Non-medical: Not on file  Tobacco Use  . Smoking status: Current Every Day Smoker    Packs/day: 1.00    Years: 24.00    Pack years: 24.00    Types: Cigarettes  . Smokeless tobacco: Never Used  Substance and Sexual Activity  .  Alcohol use: No  . Drug use: Never  . Sexual activity: Not Currently    Birth control/protection: None  Lifestyle  . Physical activity:    Days per week: Not on file    Minutes per session: Not on file  . Stress: Not on file  Relationships  . Social connections:    Talks on phone: Not on file    Gets together: Not on file    Attends religious service: Not on file    Active member of club or organization: Not on file    Attends meetings of clubs or organizations: Not on file    Relationship status: Not on file  Other Topics Concern  . Not on file  Social History Narrative  . Not on file    Hospital Course:  From admission H&P: [name] with a past psychiatric history significant for schizophrenia who presented to the [facility] emergency department on 08/23/18 secondary to an unintentional overdose. The patient stated at that time she got confused and "took a lot of medicine to make my leg feel better". The patient has a longstanding history of schizophrenia, but has not been hospitalized in over 15 years. She resides with her mother and mother's boyfriend. On evaluation this morning she denied any auditory or visual hallucinations. No evidence of paranoia.  No suicidal ideation. She just got confused about her medications. She stated she is spoken to her mother and from now on her mother is going to control her medications. She was admitted to the hospital for evaluation and stabilization. It appears that the medication she took her most likely psychiatric in origin.  Carol Vazquez was admitted after unintentional overdose. Bactrim was started for UTI. Hemoglobin a1c was 6.0. Metformin was started. Prozac, Prolixin and Geodon and other medications were continued as patient reported stability on these medications. She denied SI/HI/AVH throughout admission. She remained on the [facility] unit for 2 days. She was discharged on the medications listed below. She has  shown stable mood, affect, sleep, appetite, and interaction. She denies any SI/HI/AVH and contracts for safety. She agrees to follow up at [facility] (see below). Patient is provided with prescriptions for medications upon discharge. Her mother is picking her up for discharge home.  Physical Findings: AIMS: Facial and Oral Movements Muscles of Facial Expression: None, normal Lips and Perioral Area: None, normal Jaw: None, normal Tongue: None, normal,Extremity Movements Upper (arms, wrists, hands, fingers): None, normal Lower (legs, knees, ankles, toes): None, normal, Trunk Movements Neck, shoulders, hips: None, normal, Overall Severity Severity of abnormal movements (highest score from questions above): None, normal Incapacitation due to abnormal movements: None, normal Patient's awareness of abnormal movements (rate only patient's report): No Awareness, Dental Status Current problems with teeth and/or dentures?: No Does patient usually wear dentures?: No  CIWA:  CIWA-Ar Total: 0 COWS:  COWS Total Score: 1  Musculoskeletal: Strength & Muscle Tone: within normal limits Gait & Station: normal Patient leans: N/A  Psychiatric Specialty Exam: Physical Exam  Nursing note and vitals reviewed. Constitutional: She is oriented to person, place, and time. She appears well-developed and well-nourished.  Cardiovascular: Normal rate.  Respiratory: Effort normal.  Neurological: She is alert and oriented to person, place, and time.    Review of Systems  Constitutional: Negative.   Psychiatric/Behavioral: Negative for depression, hallucinations, substance abuse and suicidal ideas. The patient is not nervous/anxious and does not have insomnia.     Blood pressure 132/88, pulse (!) 108, temperature 98.4 F (36.9 C), temperature source Oral, resp. rate 16, height 5' (1.524 m), weight 78.5 kg, last menstrual period 08/10/2018, SpO2 94 %.Body mass index is 33.79 kg/m.  See MD's discharge SRA      Have you used any form of tobacco in the last 30 days? (Cigarettes, Smokeless Tobacco, Cigars, and/or Pipes): Yes  Has this patient used any form of tobacco in the last 30 days? (Cigarettes, Smokeless Tobacco, Cigars, and/or Pipes) Yes, a prescription for an FDA-approved medication for tobacco cessation was offered at discharge.  Blood Alcohol level:  No results found for: [facility]  Metabolic Disorder Labs:  Lab Results  Component Value Date   HGBA1C 6.0 (H) 08/25/2018   MPG 125.5 08/25/2018   Lab Results  Component Value Date   PROLACTIN 35.5 (H) 08/25/2018   Lab Results  Component Value Date   CHOL 250 (H) 08/25/2018   TRIG 317 (H) 08/25/2018   HDL 36 (L) 08/25/2018   CHOLHDL 6.9 08/25/2018   VLDL 63 (H) 08/25/2018   LDLCALC 151 (H) 08/25/2018    See Psychiatric Specialty Exam and Suicide Risk Assessment completed by Attending Physician prior to discharge.  Discharge destination:  Home  Is patient on multiple antipsychotic therapies at discharge:  Yes,   Do you recommend tapering to monotherapy for antipsychotics?  Yes   Has Patient had three  or more failed trials of antipsychotic monotherapy by history:  Unknown. Patient reports long period of stability on current medication regimen.  Recommended Plan for Multiple Antipsychotic Therapies: Taper to monotherapy as described:  Outpatient psychiatrist may taper to antipsychotic monotherapy as patient's symptoms allow.  Discharge Instructions    Discharge instructions   Complete by:  As directed    Patient is instructed to take all prescribed medications as recommended. Report any side effects or adverse reactions to your outpatient psychiatrist. Patient is instructed to abstain from alcohol and illegal drugs while on prescription medications. In the event of worsening symptoms, patient is instructed to call the crisis hotline, 911, or go to the nearest emergency department for evaluation and treatment.     Allergies as  of 08/27/2018      Reactions   Ivp Dye [iodinated Diagnostic Agents] Shortness Of Breath   Mobic [meloxicam] Shortness Of Breath, Swelling   Penicillins Diarrhea   Seroquel [quetiapine Fumarate]    Altered Mental Status      Medication List    STOP taking these medications   atorvastatin 10 MG tablet Commonly known as:  LIPITOR   Collagen Plus Vitamin C 740-125 MG Caps Generic drug:  Collagen-Vitamin C   EPINEPHrine 0.3 mg/0.3 mL Soaj injection Commonly known as:  EpiPen 2-Pak     TAKE these medications     Indication  albuterol 108 (90 Base) MCG/ACT inhaler Commonly known as:  ProAir HFA USE 2 INHALATIONS EVERY 4 TO 6 HOURS AS NEEDED FOR COUGH OR WHEEZE  Indication:  Asthma   calcium carbonate 1250 (500 Ca) MG tablet Commonly known as:  OS-CAL - dosed in mg of elemental calcium Take 1 tablet by mouth daily.  Indication:  Low Amount of Calcium in the Blood   cetirizine 10 MG tablet Commonly known as:  ZYRTEC Can take one tablet once daily if needed.  Indication:  Hayfever   cyclobenzaprine 10 MG tablet Commonly known as:  FLEXERIL Take 1 tablet by mouth 3 (three) times daily as needed.  Indication:  Muscle Spasm   FLUoxetine 20 MG capsule Commonly known as:  PROZAC Take 2 capsules (40 mg total) by mouth daily.  Indication:  Depression   fluPHENAZine 5 MG tablet Commonly known as:  PROLIXIN Take 1 tablet (5 mg total) by mouth at bedtime.  Indication:  Schizophrenia   fluticasone 50 MCG/ACT nasal spray Commonly known as:  FLONASE Use one spray in each nostril once daily.  Indication:  Allergic Rhinitis   fluticasone-salmeterol 115-21 MCG/ACT inhaler Commonly known as:  Advair HFA Inhale two puffs into the lungs twice daily to prevent cough or wheeze.  Rinse, gargle, and spit after use.  Indication:  Asthma   metFORMIN 500 MG tablet Commonly known as:  GLUCOPHAGE Take 1 tablet (500 mg total) by mouth daily with breakfast. Start taking on:  Aug 28, 2018   Indication:  Type 2 Diabetes   montelukast 10 MG tablet Commonly known as:  SINGULAIR Take one tablet once daily  Indication:  Asthma   multivitamin Tabs tablet Take 1 tablet by mouth daily. Start taking on:  Aug 28, 2018  Indication:  Supplementation   nicotine 21 mg/24hr patch Commonly known as:  NICODERM CQ - dosed in mg/24 hours Place 1 patch (21 mg total) onto the skin daily. Start taking on:  Aug 28, 2018  Indication:  Nicotine Addiction   omeprazole 40 MG capsule Commonly known as:  PRILOSEC Take 1 capsule (40 mg total) by  mouth 2 (two) times daily.  Indication:  Gastroesophageal Reflux Disease   oxybutynin 15 MG 24 hr tablet Commonly known as:  DITROPAN XL Take 1 tablet by mouth daily.  Indication:  Frequent Urination   sulfamethoxazole-trimethoprim 400-80 MG tablet Commonly known as:  BACTRIM Take 1 tablet by mouth every 12 (twelve) hours. For UTI  Indication:  Urinary Tract Infection   traMADol 50 MG tablet Commonly known as:  ULTRAM 50 mg 2 (two) times daily.  Indication:  Pain   ziprasidone 40 MG capsule Commonly known as:  Geodon Take 1 capsule (40 mg total) by mouth 2 (two) times a day.  Indication:  Schizophrenia      Follow-up [place], Best boy. Go on 08/28/2018.   Why:  You will receive a call from [facility] [place] on Thursday, May 14th, 2020. Someone will call you from [facility] and do a hospital discharge appt and do financial paperwork.  Contact information: [place] [address] [phone]           Follow-up recommendations: Follow up with primary care provider for diabetes management. Activity as tolerated. Diet as recommended by primary care physician. Keep all scheduled follow-up appointments as recommended.   Comments:   Patient is instructed to take all prescribed medications as recommended. Report any side effects or adverse reactions to your outpatient psychiatrist. Patient is instructed  to abstain from alcohol and illegal drugs while on prescription medications. In the event of worsening symptoms, patient is instructed to call the crisis hotline, 911, or go to the nearest emergency department for evaluation and treatment.  Signed: Connye Burkitt, NP 08/27/2018, 1:21 PM   Patient seen, Suicide Assessment Completed.  Disposition Plan Reviewed

## 2018-08-27 NOTE — Progress Notes (Signed)
  [facility] Adult Case Management Discharge Plan :  Will you be returning to the same living situation after discharge:  Yes,  home At discharge, do you have transportation home?: Yes,  mother is picking up at 11am Do you have the ability to pay for your medications: Yes,  Medicare  Release of information consent forms completed and in the chart. Patient to Follow up at: [place], Best boy. Go on 08/28/2018.   Why:  You will receive a call from [facility] [place] on Thursday, May 14th, 2020. Someone will call you from [facility] and do a hospital discharge appt and do financial paperwork.  Contact information: [place] [address] [phone]           Next level of care provider has access to [place] and Suicide Prevention discussed: Yes,  with mother  Have you used any form of tobacco in the last 30 days? (Cigarettes, Smokeless Tobacco, Cigars, and/or Pipes): Yes  Has patient been referred to the Quitline?: Patient refused referral  Patient has been referred for addiction treatment: Yes  Joellen Jersey, [place] 08/27/2018, 9:01 AM

## 2018-08-27 NOTE — Progress Notes (Signed)
Patient ID: Carol Vazquez, female   DOB: [birth year], 43 y.o.   MRN: [id number]  Stella NOVEL CORONAVIRUS (COVID-19) DAILY CHECK-OFF SYMPTOMS - answer yes or no to each - every day NO YES  Have you had a fever in the past 24 hours?  . Fever (Temp > 37.80C / 100F) X   Have you had any of these symptoms in the past 24 hours? . New Cough .  Sore Throat  .  Shortness of Breath .  Difficulty Breathing .  Unexplained Body Aches   X   Have you had any one of these symptoms in the past 24 hours not related to allergies?   . Runny Nose .  Nasal Congestion .  Sneezing   X   If you have had runny nose, nasal congestion, sneezing in the past 24 hours, has it worsened?  X   EXPOSURES - check yes or no X   Have you traveled outside the state in the past 14 days?  X   Have you been in contact with someone with a confirmed diagnosis of COVID-19 or PUI in the past 14 days without wearing appropriate PPE?  X   Have you been living in the same home as a person with confirmed diagnosis of COVID-19 or a PUI (household contact)?    X   Have you been diagnosed with COVID-19?    X              What to do next: Answered NO to all: Answered YES to anything:   Proceed with unit schedule Follow the [facility] Inpatient Flowsheet.

## 2018-08-28 LAB — HEPATITIS B SURFACE ANTIGEN: Hepatitis B Surface Ag: NEGATIVE

## 2018-08-28 LAB — HEPATITIS C ANTIBODY: HCV Ab: <0.1 s/co ratio (ref 0.0–0.9)

## 2018-09-01 ENCOUNTER — Other Ambulatory Visit: Payer: Self-pay | Admitting: Allergy and Immunology

## 2018-09-01 ENCOUNTER — Telehealth: Payer: Self-pay | Admitting: Allergy and Immunology

## 2018-09-01 ENCOUNTER — Telehealth: Payer: Self-pay

## 2018-09-01 ENCOUNTER — Other Ambulatory Visit: Payer: Self-pay

## 2018-09-01 DIAGNOSIS — R945 Abnormal results of liver function studies: Secondary | ICD-10-CM | POA: Diagnosis not present

## 2018-09-01 DIAGNOSIS — T50901D Poisoning by unspecified drugs, medicaments and biological substances, accidental (unintentional), subsequent encounter: Secondary | ICD-10-CM | POA: Diagnosis not present

## 2018-09-01 DIAGNOSIS — Z79899 Other long term (current) drug therapy: Secondary | ICD-10-CM | POA: Diagnosis not present

## 2018-09-01 DIAGNOSIS — R300 Dysuria: Secondary | ICD-10-CM | POA: Diagnosis not present

## 2018-09-01 DIAGNOSIS — K760 Fatty (change of) liver, not elsewhere classified: Secondary | ICD-10-CM | POA: Diagnosis not present

## 2018-09-01 DIAGNOSIS — M79604 Pain in right leg: Secondary | ICD-10-CM | POA: Diagnosis not present

## 2018-09-01 DIAGNOSIS — F17210 Nicotine dependence, cigarettes, uncomplicated: Secondary | ICD-10-CM | POA: Diagnosis not present

## 2018-09-01 MED ORDER — FLUTICASONE PROPIONATE 50 MCG/ACT NA SUSP: NASAL | 2 refills | Status: DC

## 2018-09-01 NOTE — Telephone Encounter (Signed)
 for fluticasone sent to [facility] as requested.

## 2018-09-01 NOTE — Telephone Encounter (Signed)
Patient called requesting refill of antibiotic cream for sores inside her nose.  She has misplaced the tube that Dr.Kozlow previously called in for her.  I called [facility] and refilled rx of Bactroban ointment, 22 grams, per Dr.Kozlow's request.

## 2018-09-01 NOTE — Telephone Encounter (Signed)
Carol Vazquez would like a prescription refill for Carol Vazquez sent to Carol Vazquez on [address].

## 2018-09-05 DIAGNOSIS — F209 Schizophrenia, unspecified: Secondary | ICD-10-CM | POA: Diagnosis not present

## 2018-10-06 ENCOUNTER — Other Ambulatory Visit: Payer: Self-pay

## 2018-10-06 MED ORDER — CETIRIZINE HCL 10 MG PO TABS: ORAL_TABLET | ORAL | 1 refills | Status: DC

## 2018-10-06 MED ORDER — FLUTICASONE PROPIONATE 50 MCG/ACT NA SUSP: NASAL | 1 refills | Status: DC

## 2018-10-30 ENCOUNTER — Ambulatory Visit (INDEPENDENT_AMBULATORY_CARE_PROVIDER_SITE_OTHER): Payer: Medicare Other | Admitting: Allergy and Immunology

## 2018-10-30 ENCOUNTER — Other Ambulatory Visit: Payer: Self-pay

## 2018-10-30 VITALS — BP 112/70 | HR 112 | Temp 98.3°F | Resp 20

## 2018-10-30 DIAGNOSIS — J3089 Other allergic rhinitis: Secondary | ICD-10-CM | POA: Diagnosis not present

## 2018-10-30 DIAGNOSIS — K219 Gastro-esophageal reflux disease without esophagitis: Secondary | ICD-10-CM

## 2018-10-30 DIAGNOSIS — J449 Chronic obstructive pulmonary disease, unspecified: Secondary | ICD-10-CM

## 2018-10-30 DIAGNOSIS — Z720 Tobacco use: Secondary | ICD-10-CM | POA: Diagnosis not present

## 2018-10-30 NOTE — Patient Instructions (Addendum)
  1.  Continue Advair 115 two puffs two times per day  2.  Continue Spiriva Respimat 2.5 two inhalation one time per day  2.  Discontinue Flonase     3.  Continue Omeprazole 40 one tablet two times per day  4.  Continue Montelukast 10mg  one tablet one times per day  5.  If needed:   A. Proair HFA 2 puffs or albuterol neb every 4-6 hours  B. Epi-pen  C. Pataday one drop each eye 1-2 times per day  D. OTC antihistamine one tablet one time per day  E. Azelastine 1-2 sprays each nostril 1-2 times per day  F. Nasal saline several times per day  G. Bactroban ointment in nostril 3 times per day  7. Continue to Retry nicotine substitutes to replace tobacco smoke exposure  8. Continue to try to Decrease caffeine use   9. Prednisone 10 mg - 1 tablet 1 time per day for 10 days only  10. Return in 6 months or earlier if problem  11. Obtain fall flu vaccine (and COVID vaccine)

## 2018-10-30 NOTE — Progress Notes (Signed)
[place] [facility] [place] [facility] [place]   Follow-up Note  Referring Provider: Street, Sharon Mt, * Primary Provider: Street, Sharon Mt, MD Date of Office Visit: 10/30/2018  Subjective:   Carol Vazquez (DOB: 1976-02-11) is a 43 y.o. female who returns to the [facility] on 10/30/2018 in re-evaluation of the following:  HPI: Carol Vazquez returns to this clinic in reevaluation of asthma and allergic rhinitis and reflux and tobacco use and caffeine use.  Her last visit to this clinic was 01 May 2018.  She informs me that she has been doing relatively well with her airway but unfortunately 2 weeks ago she developed really bad laryngitis and then coughing and gagging and she has been coughing so hard with thick mucus that she has been vomiting.  Fortunately, her laryngitis is actually improving significantly although she still has a cough.  She has tried some over-the-counter Mucinex which has helped her somewhat.  She did not have any fever or ugly sputum production or chest pain with this episode.  It does not sound as though she has required a systemic steroid to treat an exacerbation of her lung condition since being seen in this clinic.  She has had some irritation in her nose as well for the past 3 weeks and she restarted her Bactroban ointment which has been helping her nose.  It does not sound as though she has required an antibiotic to treat an episode of sinusitis since I have seen her in this clinic.  She believes that her reflux is under excellent control at this point.  She still drinks numerous caffeinated drinks a day.  She still continues to smoke tobacco currently 1 pack/day.  She had a recent hospitalization about 1 month ago for a behavioral health issue.  Apparently she may have overdosed on muscle relaxants.  Allergies as of 10/30/2018      Reactions   Ivp Dye [iodinated Diagnostic Agents] Shortness Of Breath   Mobic [meloxicam]  Shortness Of Breath, Swelling   Penicillins Diarrhea   Seroquel [quetiapine Fumarate]    Altered Mental Status      Medication List    albuterol 108 (90 Base) MCG/ACT inhaler Commonly known as: ProAir HFA USE 2 INHALATIONS EVERY 4 TO 6 HOURS AS NEEDED FOR COUGH OR WHEEZE   calcium carbonate 1250 (500 Ca) MG tablet Commonly known as: OS-CAL - dosed in mg of elemental calcium Take 1 tablet by mouth daily.   cetirizine 10 MG tablet Commonly known as: ZYRTEC Can take one tablet once daily if needed.   cyclobenzaprine 10 MG tablet Commonly known as: FLEXERIL Take 1 tablet by mouth 3 (three) times daily as needed.   fexofenadine 180 MG tablet Commonly known as: ALLEGRA   FLUoxetine 20 MG capsule Commonly known as: PROZAC Take 2 capsules (40 mg total) by mouth daily.   fluPHENAZine 5 MG tablet Commonly known as: PROLIXIN Take 1 tablet (5 mg total) by mouth at bedtime.   fluticasone 50 MCG/ACT nasal spray Commonly known as: FLONASE Use one spray in each nostril once daily.   fluticasone-salmeterol 115-21 MCG/ACT inhaler Commonly known as: Advair HFA Inhale two puffs into the lungs twice daily to prevent cough or wheeze.  Rinse, gargle, and spit after use.   montelukast 10 MG tablet Commonly known as: SINGULAIR Take one tablet once daily   multivitamin Tabs tablet Take 1 tablet by mouth daily.   mupirocin ointment 2 % Commonly known as: BACTROBAN APPLY  IN THE NOSTRIL AS DIRECTED UP TO THREE TIMES DAILY UNTIL HEALED   nicotine 21 mg/24hr patch Commonly known as: NICODERM CQ - dosed in mg/24 hours Place 1 patch (21 mg total) onto the skin daily.   omeprazole 40 MG capsule Commonly known as: PRILOSEC Take 1 capsule (40 mg total) by mouth 2 (two) times daily.   oxybutynin 15 MG 24 hr tablet Commonly known as: DITROPAN XL Take 1 tablet by mouth daily.   traMADol 50 MG tablet Commonly known as: ULTRAM 50 mg 2 (two) times daily.   ziprasidone 40 MG capsule  Commonly known as: Geodon Take 1 capsule (40 mg total) by mouth 2 (two) times a day.       Past Medical History:  Diagnosis Date  . Allergic rhinitis   . Anxiety   . Asthma   . Depression   . GERD (gastroesophageal reflux disease)   . Tobacco abuse     No past surgical history on file.  Review of systems negative except as noted in HPI / PMHx or noted below:  Review of Systems  Constitutional: Negative.   HENT: Negative.   Eyes: Negative.   Respiratory: Negative.   Cardiovascular: Negative.   Gastrointestinal: Negative.   Genitourinary: Negative.   Musculoskeletal: Negative.   Skin: Negative.   Neurological: Negative.   Endo/Heme/Allergies: Negative.   Psychiatric/Behavioral: Negative.      Objective:   Vitals:   10/30/18 1555  BP: 112/70  Pulse: (!) 112  Resp: 20  Temp: 98.3 F (36.8 C)  SpO2: 97%          Physical Exam Constitutional:      Appearance: She is not diaphoretic.  HENT:     Head: Normocephalic.     Right Ear: Tympanic membrane, ear canal and external ear normal.     Left Ear: Tympanic membrane, ear canal and external ear normal.     Nose: Nose normal. No mucosal edema or rhinorrhea.     Mouth/Throat:     Pharynx: Uvula midline. No oropharyngeal exudate.  Eyes:     Conjunctiva/sclera: Conjunctivae normal.  Neck:     Thyroid: No thyromegaly.     Trachea: Trachea normal. No tracheal tenderness or tracheal deviation.  Cardiovascular:     Rate and Rhythm: Normal rate and regular rhythm.     Heart sounds: Normal heart sounds, S1 normal and S2 normal. No murmur.  Pulmonary:     Effort: No respiratory distress.     Breath sounds: Normal breath sounds. No stridor. No wheezing or rales.  Lymphadenopathy:     Head:     Right side of head: No tonsillar adenopathy.     Left side of head: No tonsillar adenopathy.     Cervical: No cervical adenopathy.  Skin:    Findings: No erythema or rash.     Nails: There is no clubbing.    Neurological:     Mental Status: She is alert.     Diagnostics:    Spirometry was performed and demonstrated an FEV1 of 1.46 at 57 % of predicted.    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 Tobacco abuse   3. Other allergic rhinitis   4. Gastroesophageal reflux disease, esophagitis presence not specified     1.  Continue Advair 115 two puffs two times per day  2.  Continue Spiriva Respimat 2.5 two inhalation one time per day  2.  Discontinue Flonase  3.  Continue Omeprazole 40 one tablet two times per day  4.  Continue Montelukast 10mg  one tablet one times per day  5.  If needed:   A. Proair HFA 2 puffs or albuterol neb every 4-6 hours  B. Epi-pen  C. Pataday one drop each eye 1-2 times per day  D. OTC antihistamine one tablet one time per day  E. Azelastine 1-2 sprays each nostril 1-2 times per day  F. Nasal saline several times per day  G. Bactroban ointment in nostril 3 times per day  7. Continue to Retry nicotine substitutes to replace tobacco smoke exposure  8. Continue to try to Decrease caffeine use   9. Prednisone 10 mg - 1 tablet 1 time per day for 10 days only  10. Return in 6 months or earlier if problem  11. Obtain fall flu vaccine (and COVID vaccine)  Yuvonne appears to have a viral respiratory tract infection that is improving somewhat as she has already resolved the laryngitis associated with this event.  But she still has a lot of inflammation in her airway and we will treat her with a low dose of systemic steroids as noted above.  She will continue on a large collection of anti-inflammatory agents for her airway but I am going to eliminate her Flonase as she still continues to have intermittent problems with her nasal airway with the use of this medication.  She is a very large collection of medical therapy to utilize on a consistent basis including her therapy for reflux and several medications that she can use if needed.  I will see  her back in this clinic in 6 months or earlier if there is a problem.  Allena Katz, MD Allergy / Immunology [place]

## 2018-10-31 DIAGNOSIS — F209 Schizophrenia, unspecified: Secondary | ICD-10-CM | POA: Diagnosis not present

## 2018-11-03 ENCOUNTER — Encounter: Payer: Self-pay | Admitting: Allergy and Immunology

## 2018-11-19 DIAGNOSIS — Q600 Renal agenesis, unilateral: Secondary | ICD-10-CM | POA: Diagnosis not present

## 2018-11-19 DIAGNOSIS — R7989 Other specified abnormal findings of blood chemistry: Secondary | ICD-10-CM | POA: Diagnosis not present

## 2018-11-19 DIAGNOSIS — F200 Paranoid schizophrenia: Secondary | ICD-10-CM | POA: Diagnosis not present

## 2018-11-19 DIAGNOSIS — R300 Dysuria: Secondary | ICD-10-CM | POA: Diagnosis not present

## 2018-11-19 DIAGNOSIS — Z79899 Other long term (current) drug therapy: Secondary | ICD-10-CM | POA: Diagnosis not present

## 2018-11-19 DIAGNOSIS — E1169 Type 2 diabetes mellitus with other specified complication: Secondary | ICD-10-CM | POA: Diagnosis not present

## 2018-11-19 DIAGNOSIS — E785 Hyperlipidemia, unspecified: Secondary | ICD-10-CM | POA: Diagnosis not present

## 2018-11-19 DIAGNOSIS — F17210 Nicotine dependence, cigarettes, uncomplicated: Secondary | ICD-10-CM | POA: Diagnosis not present

## 2018-11-19 DIAGNOSIS — K219 Gastro-esophageal reflux disease without esophagitis: Secondary | ICD-10-CM | POA: Diagnosis not present

## 2018-11-19 DIAGNOSIS — F17200 Nicotine dependence, unspecified, uncomplicated: Secondary | ICD-10-CM | POA: Diagnosis not present

## 2019-02-09 ENCOUNTER — Other Ambulatory Visit: Payer: Self-pay

## 2019-02-09 MED ORDER — ADVAIR HFA 115-21 MCG/ACT IN AERO: INHALATION_SPRAY | RESPIRATORY_TRACT | 1 refills | Status: DC

## 2019-02-09 MED ORDER — SPIRIVA RESPIMAT 2.5 MCG/ACT IN AERS: 2.0000 | INHALATION_SPRAY | Freq: Every day | RESPIRATORY_TRACT | 1 refills | Status: DC

## 2019-02-09 MED ORDER — ALBUTEROL SULFATE HFA 108 (90 BASE) MCG/ACT IN AERS: INHALATION_SPRAY | RESPIRATORY_TRACT | 1 refills | Status: DC

## 2019-02-19 DIAGNOSIS — J0100 Acute maxillary sinusitis, unspecified: Secondary | ICD-10-CM | POA: Diagnosis not present

## 2019-02-19 DIAGNOSIS — R05 Cough: Secondary | ICD-10-CM | POA: Diagnosis not present

## 2019-02-19 DIAGNOSIS — K591 Functional diarrhea: Secondary | ICD-10-CM | POA: Diagnosis not present

## 2019-02-19 DIAGNOSIS — R509 Fever, unspecified: Secondary | ICD-10-CM | POA: Diagnosis not present

## 2019-03-03 DIAGNOSIS — F209 Schizophrenia, unspecified: Secondary | ICD-10-CM | POA: Diagnosis not present

## 2019-03-10 ENCOUNTER — Other Ambulatory Visit: Payer: Self-pay | Admitting: *Deleted

## 2019-03-10 MED ORDER — AZELASTINE HCL 137 MCG/SPRAY NA SOLN: 2.0000 | Freq: Two times a day (BID) | NASAL | 0 refills | Status: DC | PRN

## 2019-04-23 ENCOUNTER — Encounter: Payer: Self-pay | Admitting: Allergy and Immunology

## 2019-04-23 ENCOUNTER — Ambulatory Visit (INDEPENDENT_AMBULATORY_CARE_PROVIDER_SITE_OTHER): Payer: Medicare Other | Admitting: Allergy and Immunology

## 2019-04-23 ENCOUNTER — Other Ambulatory Visit: Payer: Self-pay

## 2019-04-23 VITALS — BP 130/58 | HR 104 | Temp 98.1°F | Resp 14 | Ht 59.0 in | Wt 188.6 lb

## 2019-04-23 DIAGNOSIS — Z720 Tobacco use: Secondary | ICD-10-CM | POA: Diagnosis not present

## 2019-04-23 DIAGNOSIS — J3089 Other allergic rhinitis: Secondary | ICD-10-CM

## 2019-04-23 DIAGNOSIS — J449 Chronic obstructive pulmonary disease, unspecified: Secondary | ICD-10-CM | POA: Diagnosis not present

## 2019-04-23 MED ORDER — CETIRIZINE HCL 10 MG PO TABS: ORAL_TABLET | ORAL | 1 refills | Status: DC

## 2019-04-23 MED ORDER — METHYLPREDNISOLONE ACETATE 80 MG/ML IJ SUSP
40.0000 mg | Freq: Once | INTRAMUSCULAR | Status: AC
  Administered 2019-04-23: 17:00:00 40 mg via INTRAMUSCULAR

## 2019-04-23 MED ORDER — ADVAIR HFA 115-21 MCG/ACT IN AERO: INHALATION_SPRAY | RESPIRATORY_TRACT | 1 refills | Status: DC

## 2019-04-23 MED ORDER — SPIRIVA RESPIMAT 2.5 MCG/ACT IN AERS: 2.0000 | INHALATION_SPRAY | Freq: Every day | RESPIRATORY_TRACT | 1 refills | Status: AC

## 2019-04-23 MED ORDER — ALBUTEROL SULFATE HFA 108 (90 BASE) MCG/ACT IN AERS: INHALATION_SPRAY | RESPIRATORY_TRACT | 0 refills | Status: DC

## 2019-04-23 MED ORDER — OMEPRAZOLE 40 MG PO CPDR: 40.0000 mg | DELAYED_RELEASE_CAPSULE | Freq: Two times a day (BID) | ORAL | 1 refills | Status: DC

## 2019-04-23 MED ORDER — MOMETASONE FUROATE 50 MCG/ACT NA SUSP: NASAL | 5 refills | Status: DC

## 2019-04-23 MED ORDER — MONTELUKAST SODIUM 10 MG PO TABS: ORAL_TABLET | ORAL | 1 refills | Status: DC

## 2019-04-23 MED ORDER — FEXOFENADINE HCL 180 MG PO TABS: ORAL_TABLET | ORAL | 1 refills | Status: AC

## 2019-04-23 MED ORDER — AZELASTINE HCL 137 MCG/SPRAY NA SOLN: 1.0000 | Freq: Two times a day (BID) | NASAL | 1 refills | Status: DC | PRN

## 2019-04-23 NOTE — Patient Instructions (Addendum)
  1.  Continue Advair 115 two puffs two times per day  2.  Continue Spiriva Respimat 2.5 two inhalation one time per day  3.  For upper airway issue, use the following:   A.  Depo-Medrol 40 IM delivered in clinic today  B.  Nasal saline wash twice a day  C.  Generic Nasonex-1 spray each nostril twice a day (PA)  D.  Azelastine-1 spray each nostril twice a day  4.  Continue Omeprazole 40 one tablet two times per day  5.  Continue Montelukast 10mg  one tablet one times per day  6.  If needed:   A. Proair HFA 2 puffs or albuterol neb every 4-6 hours  B. Epi-pen  C. Pataday one drop each eye 1-2 times per day  D. OTC antihistamine one tablet one time per day  7. Return in 6 months or earlier if problem  8. Obtain COVID vaccine when available

## 2019-04-23 NOTE — Progress Notes (Signed)
[place] [facility] [place] [facility] [place]   Follow-up Note  Referring Provider: Street, Sharon Mt, * Primary Provider: Street, Sharon Mt, MD Date of Office Visit: 04/23/2019  Subjective:   Carol Vazquez (DOB: 14-May-1975) is a 44 y.o. female who returns to the [facility] on 04/23/2019 in re-evaluation of the following:  HPI: Elmo returns to this clinic in reevaluation of asthma and allergic rhinitis and reflux and tobacco use and caffeine use.  Her last visit to this clinic was 30 October 2018.   She has been having some coughing and some wheezing and she has been having rather significant nasal congestion lately.  She needs to breath through her mouth a lot.  She has not been having any fever or ugly nasal discharge.  She can still smell and taste.  She uses her bronchodilator multiple times a day.  She continues to smoke but has decreased her smoking to 1 pack/day recently.  She believes that her reflux is under good control.  She still drinks 5 diet Pepsi's per day.  Allergies as of 04/23/2019      Reactions   Ivp Dye [iodinated Diagnostic Agents] Shortness Of Breath   Mobic [meloxicam] Shortness Of Breath, Swelling   Penicillins Diarrhea   Seroquel [quetiapine Fumarate]    Altered Mental Status      Medication List    Advair HFA 115-21 MCG/ACT inhaler Generic drug: fluticasone-salmeterol Inhale two puffs into the lungs twice daily to prevent cough or wheeze.  Rinse, gargle, and spit after use.   albuterol (2.5 MG/3ML) 0.083% nebulizer solution Commonly known as: PROVENTIL Can use one vial in nebulizer every four to six hours as needed for cough or wheeze.   albuterol 108 (90 Base) MCG/ACT inhaler Commonly known as: ProAir HFA USE 2 INHALATIONS EVERY 4 TO 6 HOURS AS NEEDED FOR COUGH OR WHEEZE   Aleve 220 MG tablet Generic drug: naproxen sodium Take by mouth.   Azelastine HCl 137 MCG/SPRAY Soln Place 1 spray into the nose 2  (two) times daily as needed. What changed: how much to take Changed by: Aarionna Germer Kevan Rosebush, MD   B-12 PO Take by mouth daily.   calcium carbonate 1250 (500 Ca) MG tablet Commonly known as: OS-CAL - dosed in mg of elemental calcium Take 1 tablet by mouth daily.   cetirizine 10 MG tablet Commonly known as: ZYRTEC Can take one tablet once daily if needed.   COLLAGEN PO Take by mouth daily.   fexofenadine 180 MG tablet Commonly known as: ALLEGRA Can take one tablet by mouth once daily if needed.   FISH OIL PO Take 2 capsules by mouth daily.   FLUoxetine 20 MG capsule Commonly known as: PROZAC Take 2 capsules (40 mg total) by mouth daily.   fluPHENAZine 5 MG tablet Commonly known as: PROLIXIN Take 1 tablet (5 mg total) by mouth at bedtime.   fluticasone 50 MCG/ACT nasal spray Commonly known as: FLONASE Use one spray in each nostril once daily.   HAIR/SKIN/NAILS PO Take by mouth daily.   mometasone 50 MCG/ACT nasal spray Commonly known as: NASONEX Use one spray in each nostril twice daily as directed. Started by: Jiles Prows, MD   montelukast 10 MG tablet Commonly known as: SINGULAIR Take one tablet once daily   multivitamin Tabs tablet Take 1 tablet by mouth daily.   omeprazole 40 MG capsule Commonly known as: PRILOSEC Take 1 capsule (40 mg total) by mouth 2 (two)  times daily.   Spiriva Respimat 2.5 MCG/ACT Aers Generic drug: Tiotropium Bromide Monohydrate Inhale 2 Doses into the lungs daily.   TIZANIDINE HCL PO Take by mouth.   traMADol 50 MG tablet Commonly known as: ULTRAM 50 mg 2 (two) times daily.   VITAMIN C PO Take by mouth daily.   ziprasidone 40 MG capsule Commonly known as: Geodon Take 1 capsule (40 mg total) by mouth 2 (two) times a day.       Past Medical History:  Diagnosis Date  . Allergic rhinitis   . Anxiety   . Asthma   . Depression   . GERD (gastroesophageal reflux disease)   . Tobacco abuse     History reviewed. No  pertinent surgical history.  Review of systems negative except as noted in HPI / PMHx or noted below:  Review of Systems  Constitutional: Negative.   HENT: Negative.   Eyes: Negative.   Respiratory: Negative.   Cardiovascular: Negative.   Gastrointestinal: Negative.   Genitourinary: Negative.   Musculoskeletal: Negative.   Skin: Negative.   Neurological: Negative.   Endo/Heme/Allergies: Negative.   Psychiatric/Behavioral: Negative.      Objective:   Vitals:   04/23/19 1554  BP: (!) 130/58  Pulse: (!) 104  Resp: 14  Temp: 98.1 F (36.7 C)  SpO2: 96%   Height: 4\' 11"  (149.9 cm)  Weight: 188 lb 9.6 oz (85.5 kg)   Physical Exam Constitutional:      Appearance: She is not diaphoretic.  HENT:     Head: Normocephalic.     Right Ear: Tympanic membrane, ear canal and external ear normal.     Left Ear: Tympanic membrane, ear canal and external ear normal.     Nose: Mucosal edema present. No rhinorrhea.     Mouth/Throat:     Pharynx: Uvula midline. No oropharyngeal exudate.  Eyes:     Conjunctiva/sclera: Conjunctivae normal.  Neck:     Thyroid: No thyromegaly.     Trachea: Trachea normal. No tracheal tenderness or tracheal deviation.  Cardiovascular:     Rate and Rhythm: Normal rate and regular rhythm.     Heart sounds: Normal heart sounds, S1 normal and S2 normal. No murmur.  Pulmonary:     Effort: No respiratory distress.     Breath sounds: Normal breath sounds. No stridor. No wheezing or rales.  Lymphadenopathy:     Head:     Right side of head: No tonsillar adenopathy.     Left side of head: No tonsillar adenopathy.     Cervical: No cervical adenopathy.  Skin:    Findings: No erythema or rash.     Nails: There is no clubbing.  Neurological:     Mental Status: She is alert.     Diagnostics:    Spirometry was performed and demonstrated an FEV1 of 1.37 at 55 % of predicted.  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 Other allergic rhinitis    3. Tobacco abuse     1.  Continue Advair 115 two puffs two times per day  2.  Continue Spiriva Respimat 2.5 two inhalation one time per day  3.  For upper airway issue, use the following:   A.  Depo-Medrol 40 IM delivered in clinic today  B.  Nasal saline wash twice a day  C.  Generic Nasonex-1 spray each nostril twice a day (PA)  D.  Azelastine-1 spray each nostril twice a day  4.  Continue  Omeprazole 40 one tablet two times per day  5.  Continue Montelukast 10mg  one tablet one times per day  6.  If needed:   A. Proair HFA 2 puffs or albuterol neb every 4-6 hours  B. Epi-pen  C. Pataday one drop each eye 1-2 times per day  D. OTC antihistamine one tablet one time per day  7. Return in 6 months or earlier if problem  8. Obtain COVID vaccine when available  Zykiria definitely has very significant inflammation of her upper airway and we will treat her with a plan noted above and assuming she continues to do well on this form of therapy we will see her back in this clinic in 6 months.  Of course, I encouraged her to stop smoking which would help her airway significantly and she should stop drinking as much caffeine as she does which would help her reflux significantly and we did touch on these issues during today's visit.  Allena Katz, MD Allergy / Immunology [place]

## 2019-04-28 DIAGNOSIS — N3020 Other chronic cystitis without hematuria: Secondary | ICD-10-CM | POA: Diagnosis not present

## 2019-04-28 DIAGNOSIS — N3281 Overactive bladder: Secondary | ICD-10-CM | POA: Diagnosis not present

## 2019-04-30 ENCOUNTER — Ambulatory Visit: Payer: Medicare Other | Admitting: Allergy and Immunology

## 2019-05-04 ENCOUNTER — Encounter: Payer: Self-pay | Admitting: Allergy and Immunology

## 2019-05-16 DIAGNOSIS — E782 Mixed hyperlipidemia: Secondary | ICD-10-CM | POA: Diagnosis not present

## 2019-05-16 DIAGNOSIS — N3946 Mixed incontinence: Secondary | ICD-10-CM | POA: Diagnosis not present

## 2019-05-16 DIAGNOSIS — Q600 Renal agenesis, unilateral: Secondary | ICD-10-CM | POA: Diagnosis not present

## 2019-06-02 DIAGNOSIS — F209 Schizophrenia, unspecified: Secondary | ICD-10-CM | POA: Diagnosis not present

## 2019-06-12 DIAGNOSIS — Z23 Encounter for immunization: Secondary | ICD-10-CM | POA: Diagnosis not present

## 2019-07-10 DIAGNOSIS — Z23 Encounter for immunization: Secondary | ICD-10-CM | POA: Diagnosis not present

## 2019-08-20 DIAGNOSIS — R350 Frequency of micturition: Secondary | ICD-10-CM | POA: Diagnosis not present

## 2019-08-20 DIAGNOSIS — N390 Urinary tract infection, site not specified: Secondary | ICD-10-CM | POA: Diagnosis not present

## 2019-08-20 DIAGNOSIS — R339 Retention of urine, unspecified: Secondary | ICD-10-CM | POA: Diagnosis not present

## 2019-08-20 DIAGNOSIS — R309 Painful micturition, unspecified: Secondary | ICD-10-CM | POA: Diagnosis not present

## 2019-08-20 DIAGNOSIS — R3129 Other microscopic hematuria: Secondary | ICD-10-CM | POA: Diagnosis not present

## 2019-08-20 DIAGNOSIS — R300 Dysuria: Secondary | ICD-10-CM | POA: Diagnosis not present

## 2019-08-20 DIAGNOSIS — N200 Calculus of kidney: Secondary | ICD-10-CM | POA: Diagnosis not present

## 2019-08-24 ENCOUNTER — Telehealth: Payer: Self-pay | Admitting: Allergy and Immunology

## 2019-08-24 NOTE — Telephone Encounter (Signed)
Carol Vazquez called in and states she was reading the label on Nasonex and it stated it can cause glaucoma.  Carol Vazquez wanted to know if she should switch nasal sprays.  If switched or stays the same, she would like a 90 supply called in to [facility] from now on.

## 2019-08-25 ENCOUNTER — Other Ambulatory Visit: Payer: Self-pay | Admitting: *Deleted

## 2019-08-25 DIAGNOSIS — F209 Schizophrenia, unspecified: Secondary | ICD-10-CM | POA: Diagnosis not present

## 2019-08-25 MED ORDER — MOMETASONE FUROATE 50 MCG/ACT NA SUSP: NASAL | 0 refills | Status: DC

## 2019-08-25 NOTE — Telephone Encounter (Signed)
Patient informed.  She does see an eye doctor yearly and I told her to have them check her eye pressure when she goes for her appointment.

## 2019-08-25 NOTE — Telephone Encounter (Signed)
All nasal steroids have that warning. Make sure she has her eye pressure checked by an eye doctor while using the Nasonex.

## 2019-08-25 NOTE — Telephone Encounter (Signed)
Carol Vazquez called again wanting to know if Dr. Neldon Mc thinks she should switch her nasal spray due to warning that it could cause glaucoma.  If Dr. Neldon Mc does switch her, she would like [place] to call new RX into [facility] on [address] in [place]. Please advise.

## 2019-08-31 DIAGNOSIS — E7800 Pure hypercholesterolemia, unspecified: Secondary | ICD-10-CM | POA: Diagnosis not present

## 2019-08-31 DIAGNOSIS — K591 Functional diarrhea: Secondary | ICD-10-CM | POA: Diagnosis not present

## 2019-08-31 DIAGNOSIS — M79671 Pain in right foot: Secondary | ICD-10-CM | POA: Diagnosis not present

## 2019-08-31 DIAGNOSIS — E785 Hyperlipidemia, unspecified: Secondary | ICD-10-CM | POA: Diagnosis not present

## 2019-08-31 DIAGNOSIS — E1169 Type 2 diabetes mellitus with other specified complication: Secondary | ICD-10-CM | POA: Diagnosis not present

## 2019-08-31 DIAGNOSIS — G8929 Other chronic pain: Secondary | ICD-10-CM | POA: Diagnosis not present

## 2019-08-31 DIAGNOSIS — F17200 Nicotine dependence, unspecified, uncomplicated: Secondary | ICD-10-CM | POA: Diagnosis not present

## 2019-08-31 DIAGNOSIS — Z79899 Other long term (current) drug therapy: Secondary | ICD-10-CM | POA: Diagnosis not present

## 2019-08-31 DIAGNOSIS — F17210 Nicotine dependence, cigarettes, uncomplicated: Secondary | ICD-10-CM | POA: Diagnosis not present

## 2019-09-03 DIAGNOSIS — N200 Calculus of kidney: Secondary | ICD-10-CM | POA: Diagnosis not present

## 2019-09-03 DIAGNOSIS — N3592 Unspecified urethral stricture, female: Secondary | ICD-10-CM | POA: Diagnosis not present

## 2019-09-14 DIAGNOSIS — E1169 Type 2 diabetes mellitus with other specified complication: Secondary | ICD-10-CM | POA: Diagnosis not present

## 2019-09-14 DIAGNOSIS — J449 Chronic obstructive pulmonary disease, unspecified: Secondary | ICD-10-CM | POA: Diagnosis not present

## 2019-09-14 DIAGNOSIS — E785 Hyperlipidemia, unspecified: Secondary | ICD-10-CM | POA: Diagnosis not present

## 2019-09-23 DIAGNOSIS — Z1231 Encounter for screening mammogram for malignant neoplasm of breast: Secondary | ICD-10-CM | POA: Diagnosis not present

## 2019-09-30 ENCOUNTER — Other Ambulatory Visit: Payer: Self-pay | Admitting: *Deleted

## 2019-09-30 MED ORDER — CETIRIZINE HCL 10 MG PO TABS: ORAL_TABLET | ORAL | 0 refills | Status: DC

## 2019-10-22 ENCOUNTER — Ambulatory Visit (INDEPENDENT_AMBULATORY_CARE_PROVIDER_SITE_OTHER): Payer: Medicare Other | Admitting: Allergy and Immunology

## 2019-10-22 ENCOUNTER — Other Ambulatory Visit: Payer: Self-pay

## 2019-10-22 ENCOUNTER — Encounter: Payer: Self-pay | Admitting: Allergy and Immunology

## 2019-10-22 VITALS — BP 132/78 | HR 100 | Resp 16

## 2019-10-22 DIAGNOSIS — J3089 Other allergic rhinitis: Secondary | ICD-10-CM | POA: Diagnosis not present

## 2019-10-22 DIAGNOSIS — H1010 Acute atopic conjunctivitis, unspecified eye: Secondary | ICD-10-CM | POA: Diagnosis not present

## 2019-10-22 DIAGNOSIS — Z720 Tobacco use: Secondary | ICD-10-CM

## 2019-10-22 DIAGNOSIS — J449 Chronic obstructive pulmonary disease, unspecified: Secondary | ICD-10-CM | POA: Diagnosis not present

## 2019-10-22 DIAGNOSIS — K219 Gastro-esophageal reflux disease without esophagitis: Secondary | ICD-10-CM | POA: Diagnosis not present

## 2019-10-22 MED ORDER — ALBUTEROL SULFATE (2.5 MG/3ML) 0.083% IN NEBU: INHALATION_SOLUTION | RESPIRATORY_TRACT | 1 refills | Status: AC

## 2019-10-22 MED ORDER — CETIRIZINE HCL 10 MG PO TABS: ORAL_TABLET | ORAL | 1 refills | Status: AC

## 2019-10-22 MED ORDER — OMEPRAZOLE 40 MG PO CPDR: 40.0000 mg | DELAYED_RELEASE_CAPSULE | Freq: Two times a day (BID) | ORAL | 1 refills | Status: AC

## 2019-10-22 MED ORDER — AZELASTINE HCL 137 MCG/SPRAY NA SOLN: 1.0000 | Freq: Two times a day (BID) | NASAL | 1 refills | Status: AC | PRN

## 2019-10-22 MED ORDER — MONTELUKAST SODIUM 10 MG PO TABS: ORAL_TABLET | ORAL | 1 refills | Status: AC

## 2019-10-22 MED ORDER — ALBUTEROL SULFATE HFA 108 (90 BASE) MCG/ACT IN AERS: INHALATION_SPRAY | RESPIRATORY_TRACT | 0 refills | Status: AC

## 2019-10-22 MED ORDER — MOMETASONE FUROATE 50 MCG/ACT NA SUSP: NASAL | 1 refills | Status: AC

## 2019-10-22 MED ORDER — TRELEGY ELLIPTA 200-62.5-25 MCG/INH IN AEPB: INHALATION_SPRAY | RESPIRATORY_TRACT | 1 refills | Status: AC

## 2019-10-22 MED ORDER — EPINEPHRINE 0.3 MG/0.3ML IJ SOAJ
INTRAMUSCULAR | 3 refills | Status: AC
Start: 2019-10-22 — End: ?

## 2019-10-22 NOTE — Progress Notes (Signed)
[place] [facility] [place] [facility] [place]   Follow-up Note  Referring Provider: Street, Sharon Mt, * Primary Provider: Street, Sharon Mt, MD Date of Office Visit: 10/22/2019  Subjective:   Carol Vazquez (DOB: 04-25-1975) is a 44 y.o. female who returns to the [facility] [place] on 10/22/2019 in re-evaluation of the following:  HPI: Carol Vazquez returns to this clinic in reevaluation of asthma and allergic rhinitis and reflux and tobacco use and caffeine use.  Her last visit to this clinic was 23 April 2019.  She describes several new diagnoses today including type 2 diabetes and peripheral neuropathy and fatty liver and diastolic dysfunction and hiatal hernia.  Her complaints today revolve around the fact that she has some discomfort in her chest.  She has had some shortness of breath.  She has been using her bronchodilator few times per day.  She still continues to smoke 1 pack/day.  She would like different inhalers that work a little bit better.  Her nose has really been doing quite well.  She has had no problems with her reflux.  She has been having some itchy crusty irritated eyes for the past several weeks.  She has been rubbing her eyes quite a lot.  She has been using some type of gel drop in her eye which is not really helping very much.  Her last eye exam was about 2 years ago.  She has obtained 2 ModernaCovid vaccinations.  Allergies as of 10/22/2019      Reactions   Ivp Dye [iodinated Diagnostic Agents] Shortness Of Breath   Mobic [meloxicam] Shortness Of Breath, Swelling   Penicillins Diarrhea   Seroquel [quetiapine Fumarate]    Altered Mental Status      Medication List    Advair HFA 115-21 MCG/ACT inhaler Generic drug: fluticasone-salmeterol Inhale two puffs into the lungs twice daily to prevent cough or wheeze.  Rinse, gargle, and spit after use.   albuterol (2.5 MG/3ML) 0.083% nebulizer solution Commonly known as:  PROVENTIL Can use one vial in nebulizer every four to six hours as needed for cough or wheeze.   albuterol 108 (90 Base) MCG/ACT inhaler Commonly known as: ProAir HFA USE 2 INHALATIONS EVERY 4 TO 6 HOURS AS NEEDED FOR COUGH OR WHEEZE   Aleve 220 MG tablet Generic drug: naproxen sodium Take by mouth.   atorvastatin 10 MG tablet Commonly known as: LIPITOR Take 10 mg by mouth daily.   Azelastine HCl 137 MCG/SPRAY Soln Place 1 spray into the nose 2 (two) times daily as needed.   B-12 PO Take by mouth daily.   calcium carbonate 1250 (500 Ca) MG tablet Commonly known as: OS-CAL - dosed in mg of elemental calcium Take 1 tablet by mouth daily.   cetirizine 10 MG tablet Commonly known as: ZYRTEC Can take one tablet once daily if needed.   COLLAGEN PO Take by mouth daily.   fexofenadine 180 MG tablet Commonly known as: ALLEGRA Can take one tablet by mouth once daily if needed.   FISH OIL PO Take 2 capsules by mouth daily.   FLUoxetine 20 MG capsule Commonly known as: PROZAC Take 2 capsules (40 mg total) by mouth daily.   fluPHENAZine 5 MG tablet Commonly known as: PROLIXIN Take 1 tablet (5 mg total) by mouth at bedtime.   gabapentin 100 MG capsule Commonly known as: NEURONTIN Take 100 mg by mouth at bedtime.   HAIR/SKIN/NAILS PO Take by mouth daily.   metFORMIN 500 MG  tablet Commonly known as: GLUCOPHAGE Take 500 mg by mouth 2 (two) times daily.   mometasone 50 MCG/ACT nasal spray Commonly known as: NASONEX Use one spray in each nostril twice daily as directed.   montelukast 10 MG tablet Commonly known as: SINGULAIR Take one tablet once daily   multivitamin Tabs tablet Take 1 tablet by mouth daily.   omega-3 acid ethyl esters 1 g capsule Commonly known as: LOVAZA Take 2 capsules by mouth 2 (two) times daily.   omeprazole 40 MG capsule Commonly known as: PRILOSEC Take 1 capsule (40 mg total) by mouth 2 (two) times daily.   Spiriva Respimat 2.5 MCG/ACT  Aers Generic drug: Tiotropium Bromide Monohydrate Inhale 2 Doses into the lungs daily.   traMADol 50 MG tablet Commonly known as: ULTRAM 50 mg 2 (two) times daily.   VITAMIN C PO Take by mouth daily.   ziprasidone 40 MG capsule Commonly known as: Geodon Take 1 capsule (40 mg total) by mouth 2 (two) times a day.       Past Medical History:  Diagnosis Date   Allergic rhinitis    Anxiety    Asthma    Depression    Diabetes mellitus, type II ([place])    GERD (gastroesophageal reflux disease)    Neuropathy    Tobacco abuse     History reviewed. No pertinent surgical history.  Review of systems negative except as noted in HPI / PMHx or noted below:  Review of Systems  Constitutional: Negative.   HENT: Negative.   Eyes: Negative.   Respiratory: Negative.   Cardiovascular: Negative.   Gastrointestinal: Negative.   Genitourinary: Negative.   Musculoskeletal: Negative.   Skin: Negative.   Neurological: Negative.   Endo/Heme/Allergies: Negative.   Psychiatric/Behavioral: Negative.      Objective:   Vitals:   10/22/19 1520  BP: 132/78  Pulse: 100  Resp: 16  SpO2: 97%          Physical Exam Constitutional:      Appearance: She is not diaphoretic.  HENT:     Head: Normocephalic.     Right Ear: Tympanic membrane, ear canal and external ear normal.     Left Ear: Tympanic membrane, ear canal and external ear normal.     Nose: Nose normal. No mucosal edema or rhinorrhea.     Mouth/Throat:     Pharynx: Uvula midline. No oropharyngeal exudate.  Eyes:     Conjunctiva/sclera:     Right eye: Right conjunctiva is injected.     Left eye: Left conjunctiva is injected.  Neck:     Thyroid: No thyromegaly.     Trachea: Trachea normal. No tracheal tenderness or tracheal deviation.  Cardiovascular:     Rate and Rhythm: Normal rate and regular rhythm.     Heart sounds: Normal heart sounds, S1 normal and S2 normal. No murmur heard.   Pulmonary:     Effort: No  respiratory distress.     Breath sounds: Normal breath sounds. No stridor. No wheezing or rales.  Lymphadenopathy:     Head:     Right side of head: No tonsillar adenopathy.     Left side of head: No tonsillar adenopathy.     Cervical: No cervical adenopathy.  Skin:    Findings: No erythema or rash.     Nails: There is no clubbing.  Neurological:     Mental Status: She is alert.     Diagnostics:    Spirometry was performed and demonstrated an FEV1  of 1.05 at 42 % of predicted.  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 Other allergic rhinitis   3. Seasonal allergic conjunctivitis   4. LPRD (laryngopharyngeal reflux disease)   5. Tobacco abuse     1.  Start Trelegy 200 - 1 inhalation 1 time per day (replaces Advair + Spriva)  2.  Use nicotine substitutes to replace tobacco smoke exposure  3. Do not rub or touch eyes. Use OTC systane gel drops at bedtime and regular drop throughout the day  4. Continue Nasonex + azelastine -1 spray each nostril twice a day   5.  Continue Omeprazole 40 one tablet two times per day  6.  Continue Montelukast 10mg  one tablet one times per day  7.  If needed:   A. Proair HFA 2 puffs or albuterol neb every 4-6 hours  B. Epi-pen  C. Pataday one drop each eye 1-2 times per day  D. OTC antihistamine one tablet one time per day  8. Obtain eye exam  9. Return in 6 months or earlier if problem  10. Obtain fall flu vaccine  Currently we will start a triple inhaler and hopefully eliminate tobacco smoke exposure.  Eliminating tobacco smoke exposure is the one manipulation that she could make that would make the biggest impact on her life and I had a talk with her today about this issue.  We need to get her to stop rubbing her eyes.  She can use Pataday and Systane.  She should have an eye exam specially given the fact that she has type 2 diabetes.  She will remain on a collection of agents directed against inflammation of her airway  and reflux as noted above.  I will see her back in this clinic in 6 months or earlier if there is a problem.  Allena Katz, MD Allergy / Immunology [place]

## 2019-10-22 NOTE — Patient Instructions (Addendum)
  1.  Start Trelegy 200 - 1 inhalation 1 time per day (replaces Advair + Spriva)  2.  Use nicotine substitutes to replace tobacco smoke exposure  3. Do not rub or touch eyes. Use OTC systane gel drops at bedtime and regular drop throughout the day  4. Continue Nasonex + azelastine -1 spray each nostril twice a day   5.  Continue Omeprazole 40 one tablet two times per day  6.  Continue Montelukast 10mg  one tablet one times per day  7.  If needed:   A. Proair HFA 2 puffs or albuterol neb every 4-6 hours  B. Epi-pen  C. Pataday one drop each eye 1-2 times per day  D. OTC antihistamine one tablet one time per day  8. Obtain eye exam  9. Return in 6 months or earlier if problem  10. Obtain fall flu vaccine

## 2019-10-26 ENCOUNTER — Encounter: Payer: Self-pay | Admitting: Allergy and Immunology

## 2019-11-03 IMAGING — US ULTRASOUND ABDOMEN LIMITED
1 series · 14 of 25 positions shown · non-contrast
Comparison: CT 09/05/2008

CLINICAL DATA: 42-year-old female with abnormal LFTs

EXAM:
ULTRASOUND ABDOMEN LIMITED RIGHT UPPER QUADRANT

[Series 1: ultrasound abdomen limited · 61 acquisitions, 14 frames shown]
[im 1/61]
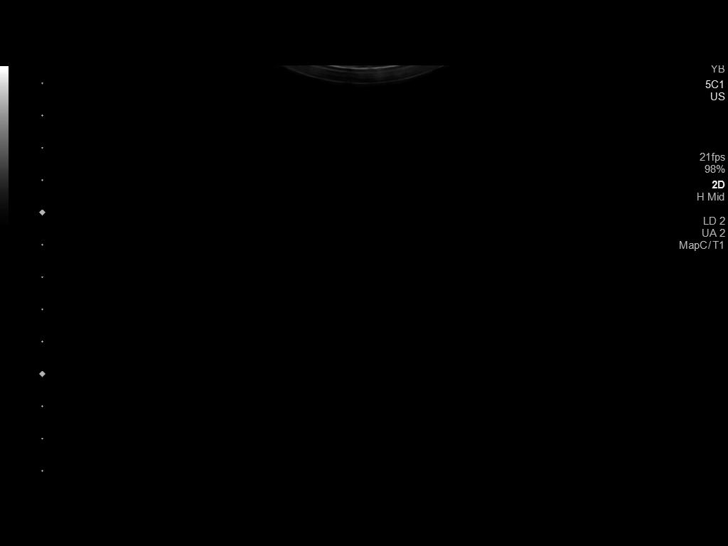
[im 6/61]
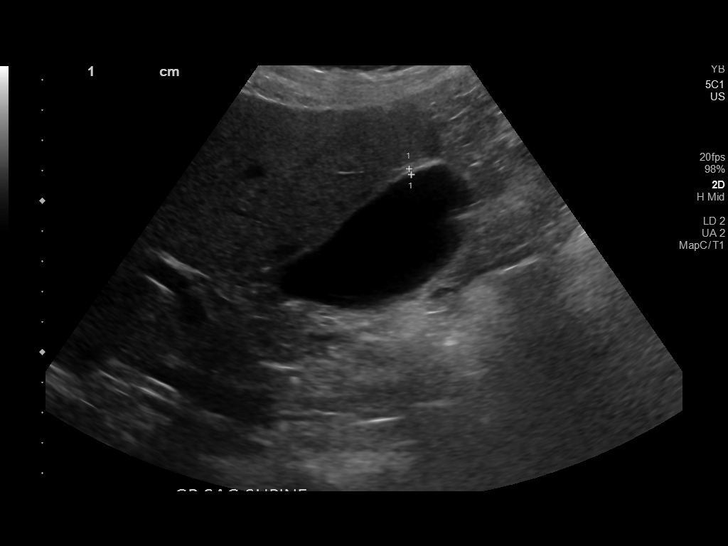
[im 11/61]
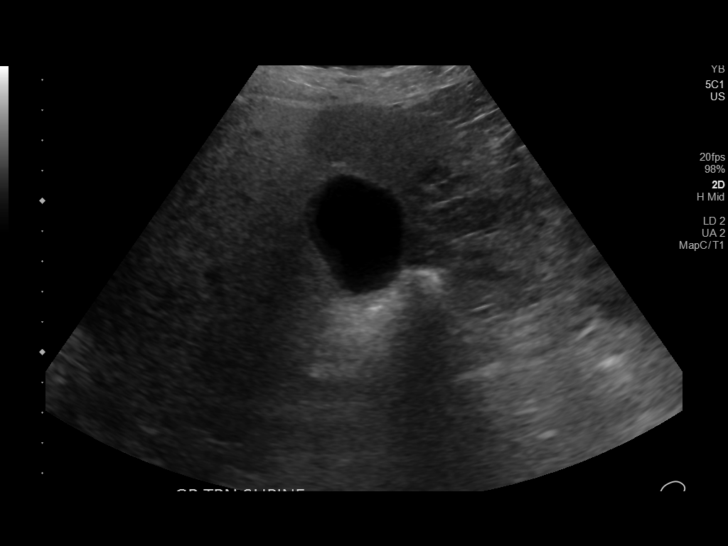
[im 16/61]
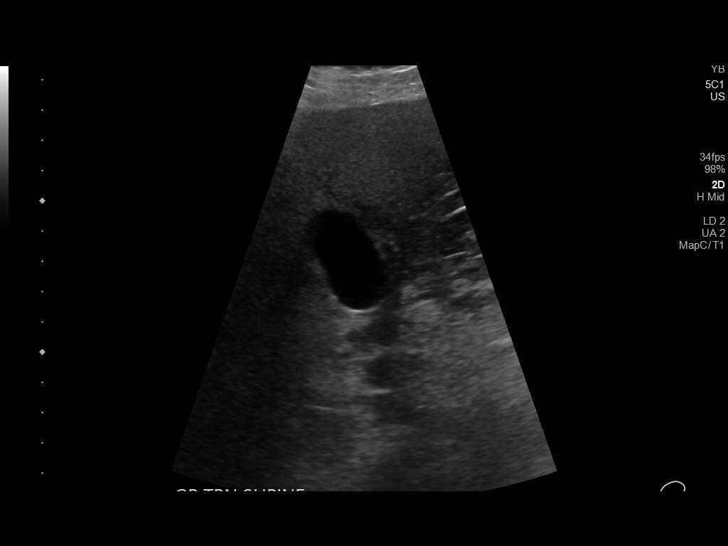
[im 21/61]
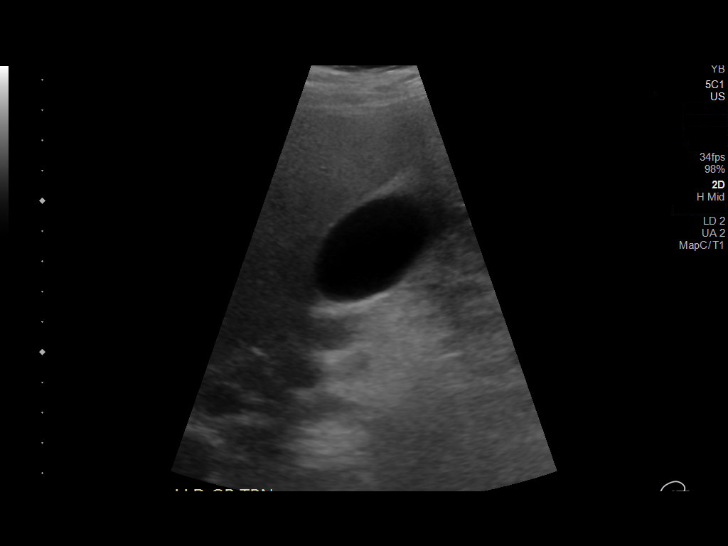
[im 23/61]
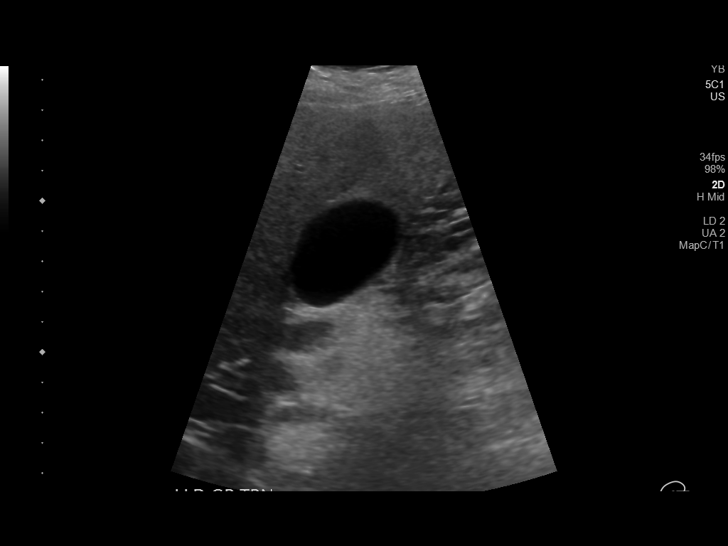
[im 28/61]
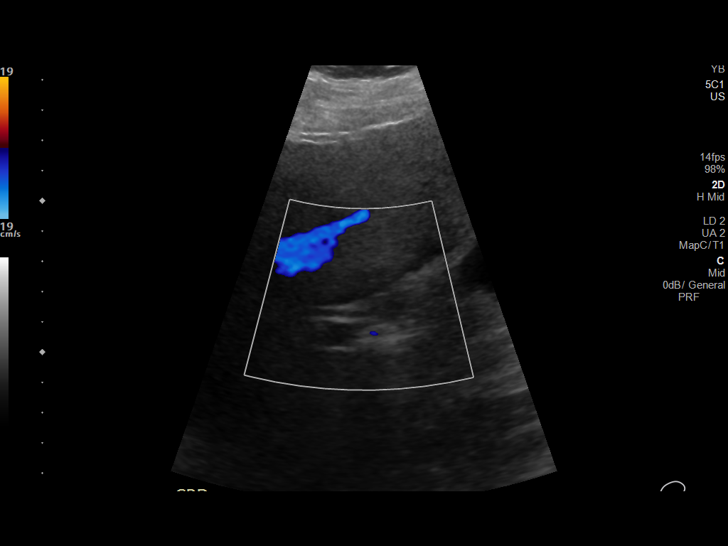
[im 33/61]
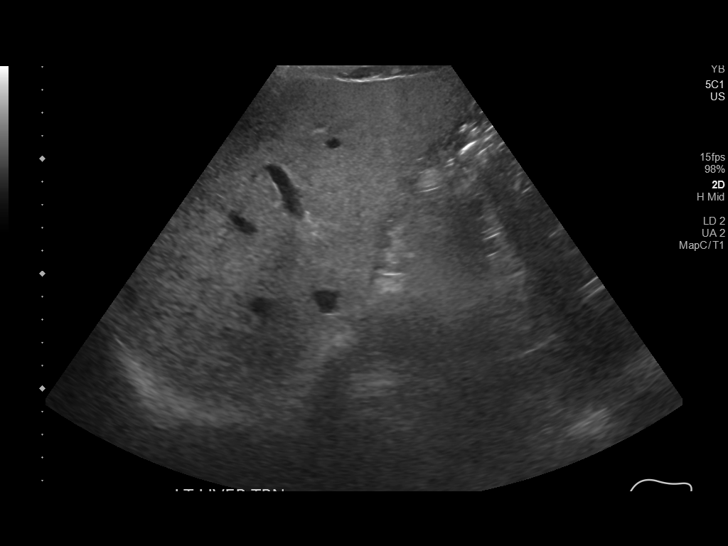
[im 38/61]
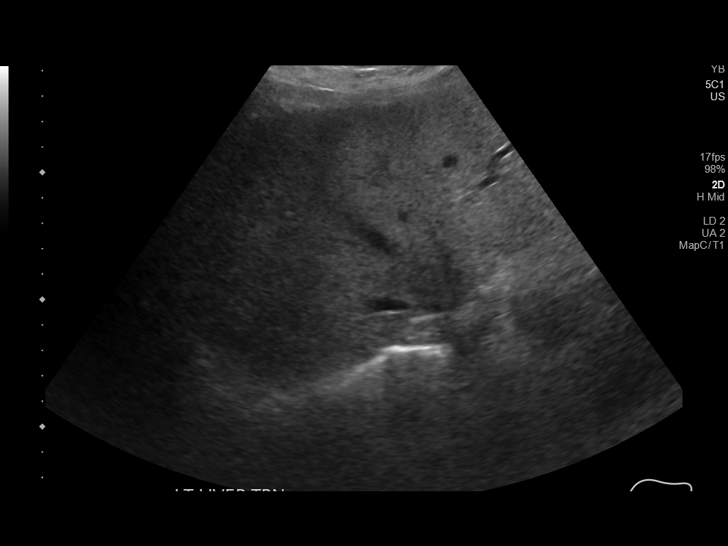
[im 41/61]
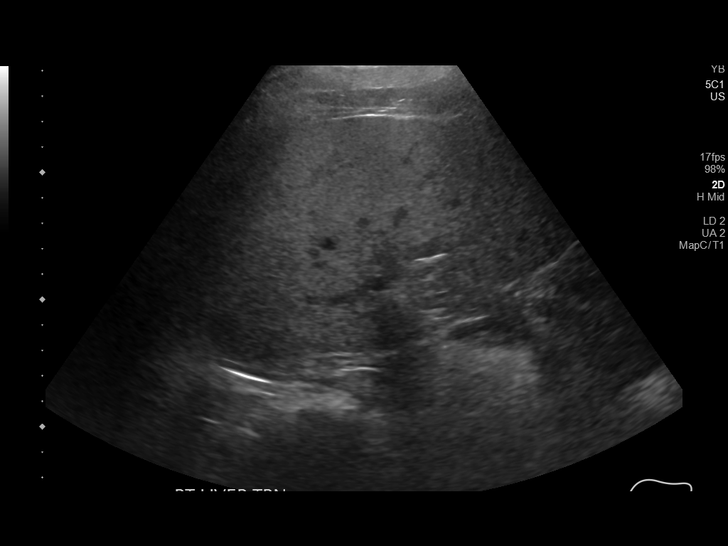
[im 46/61]
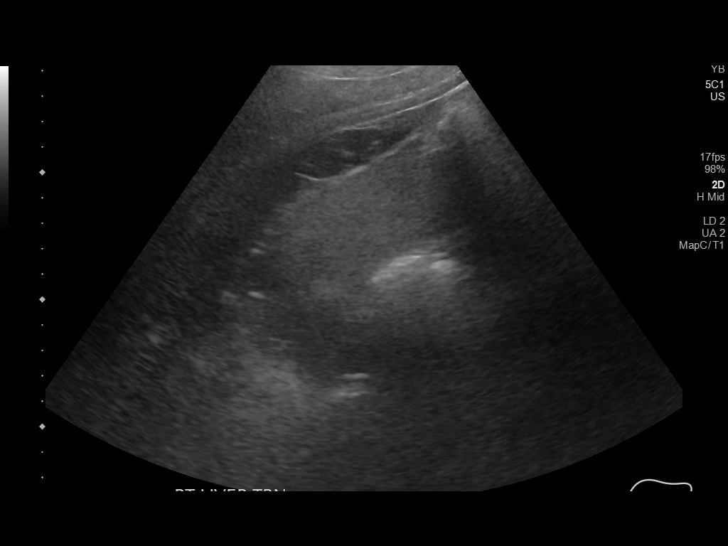
[im 51/61]
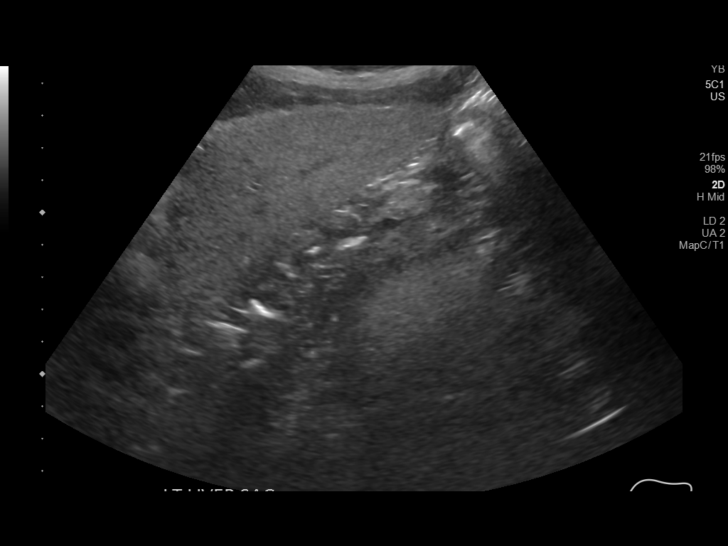
[im 56/61]
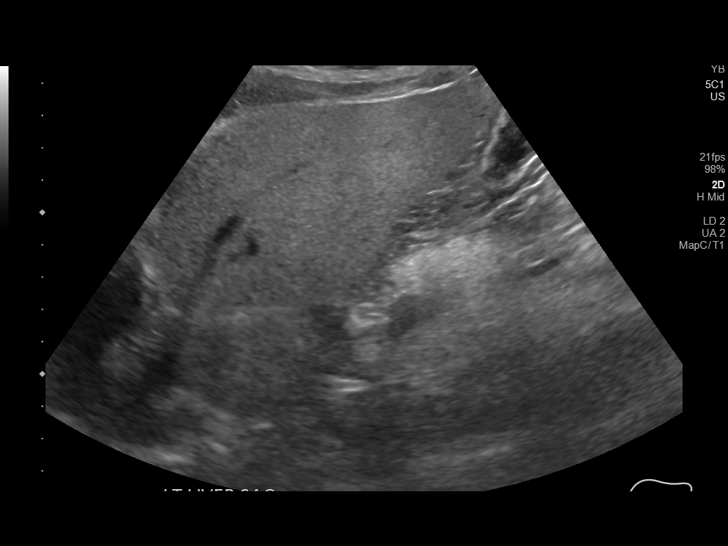
[im 61/61]
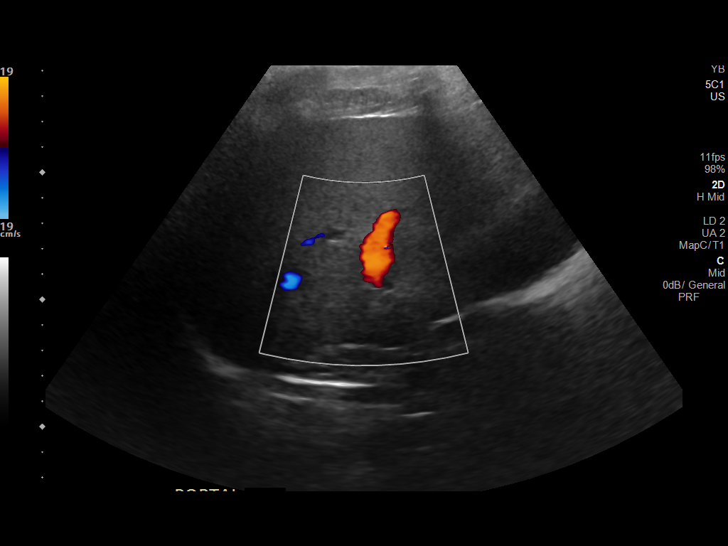

[14 of 25 positions shown; findings below may reference images not displayed]

FINDINGS: Gallbladder:

No gallstones or wall thickening visualized. No sonographic Murphy
sign noted by sonographer.

Common bile duct:

Diameter: 3 mm

Liver:

Mild coarsened appearance of the liver parenchyma echogenicity. No
focal lesion. Portal vein is patent on color Doppler imaging with
normal direction of blood flow towards the liver.
IMPRESSION: Unremarkable appearance of the gallbladder.

Coarsened appearance of liver parenchyma may reflect steatosis or
other medical liver disease.

## 2019-11-24 DIAGNOSIS — F209 Schizophrenia, unspecified: Secondary | ICD-10-CM | POA: Diagnosis not present

## 2019-11-28 DIAGNOSIS — J40 Bronchitis, not specified as acute or chronic: Secondary | ICD-10-CM | POA: Diagnosis not present

## 2019-11-28 DIAGNOSIS — Z20822 Contact with and (suspected) exposure to covid-19: Secondary | ICD-10-CM | POA: Diagnosis not present

## 2019-11-28 DIAGNOSIS — J028 Acute pharyngitis due to other specified organisms: Secondary | ICD-10-CM | POA: Diagnosis not present

## 2019-12-02 DIAGNOSIS — Q600 Renal agenesis, unilateral: Secondary | ICD-10-CM | POA: Diagnosis not present

## 2019-12-02 DIAGNOSIS — J040 Acute laryngitis: Secondary | ICD-10-CM | POA: Diagnosis not present

## 2019-12-02 DIAGNOSIS — J449 Chronic obstructive pulmonary disease, unspecified: Secondary | ICD-10-CM | POA: Diagnosis not present

## 2019-12-02 DIAGNOSIS — R300 Dysuria: Secondary | ICD-10-CM | POA: Diagnosis not present

## 2019-12-02 DIAGNOSIS — J329 Chronic sinusitis, unspecified: Secondary | ICD-10-CM | POA: Diagnosis not present

## 2019-12-02 DIAGNOSIS — Z6835 Body mass index (BMI) 35.0-35.9, adult: Secondary | ICD-10-CM | POA: Diagnosis not present

## 2019-12-02 DIAGNOSIS — J40 Bronchitis, not specified as acute or chronic: Secondary | ICD-10-CM | POA: Diagnosis not present

## 2019-12-09 DIAGNOSIS — R5081 Fever presenting with conditions classified elsewhere: Secondary | ICD-10-CM | POA: Diagnosis not present

## 2019-12-09 DIAGNOSIS — R918 Other nonspecific abnormal finding of lung field: Secondary | ICD-10-CM | POA: Diagnosis not present

## 2019-12-09 DIAGNOSIS — J189 Pneumonia, unspecified organism: Secondary | ICD-10-CM | POA: Diagnosis not present

## 2019-12-09 DIAGNOSIS — Z20822 Contact with and (suspected) exposure to covid-19: Secondary | ICD-10-CM | POA: Diagnosis not present

## 2019-12-09 DIAGNOSIS — J441 Chronic obstructive pulmonary disease with (acute) exacerbation: Secondary | ICD-10-CM | POA: Diagnosis not present

## 2019-12-09 DIAGNOSIS — R05 Cough: Secondary | ICD-10-CM | POA: Diagnosis not present

## 2019-12-16 DEATH — deceased

## 2020-01-11 ENCOUNTER — Other Ambulatory Visit: Payer: Self-pay | Admitting: Allergy and Immunology

## 2020-04-27 ENCOUNTER — Ambulatory Visit: Payer: Medicare Other | Admitting: Allergy and Immunology
